# Patient Record
Sex: Female | Born: 1960 | Race: White | Hispanic: No | Smoking: Former smoker
Health system: Southern US, Community
[De-identification: ages and names within clinical notes are randomized; demographics above are authoritative.]

## PROBLEM LIST (undated history)

## (undated) DIAGNOSIS — M8580 Other specified disorders of bone density and structure, unspecified site: Secondary | ICD-10-CM

## (undated) DIAGNOSIS — R5383 Other fatigue: Secondary | ICD-10-CM

## (undated) DIAGNOSIS — I779 Disorder of arteries and arterioles, unspecified: Secondary | ICD-10-CM

## (undated) DIAGNOSIS — K769 Liver disease, unspecified: Secondary | ICD-10-CM

## (undated) DIAGNOSIS — S62609A Fracture of unspecified phalanx of unspecified finger, initial encounter for closed fracture: Secondary | ICD-10-CM

## (undated) DIAGNOSIS — J301 Allergic rhinitis due to pollen: Secondary | ICD-10-CM

## (undated) DIAGNOSIS — G459 Transient cerebral ischemic attack, unspecified: Secondary | ICD-10-CM

## (undated) DIAGNOSIS — K760 Fatty (change of) liver, not elsewhere classified: Secondary | ICD-10-CM

## (undated) DIAGNOSIS — K219 Gastro-esophageal reflux disease without esophagitis: Secondary | ICD-10-CM

## (undated) DIAGNOSIS — E7800 Pure hypercholesterolemia, unspecified: Secondary | ICD-10-CM

## (undated) DIAGNOSIS — G4700 Insomnia, unspecified: Secondary | ICD-10-CM

## (undated) HISTORY — DX: Insomnia, unspecified: G47.00

## (undated) HISTORY — DX: Pure hypercholesterolemia, unspecified: E78.00

## (undated) HISTORY — DX: Gastro-esophageal reflux disease without esophagitis: K21.9

## (undated) HISTORY — DX: Fracture of unspecified phalanx of unspecified finger, initial encounter for closed fracture: S62.609A

## (undated) HISTORY — PX: GANGLION CYST EXCISION: SHX1691

## (undated) HISTORY — DX: Allergic rhinitis due to pollen: J30.1

## (undated) HISTORY — DX: Transient cerebral ischemic attack, unspecified: G45.9

## (undated) HISTORY — DX: Fatty (change of) liver, not elsewhere classified: K76.0

## (undated) HISTORY — DX: Other fatigue: R53.83

## (undated) HISTORY — DX: Other specified disorders of bone density and structure, unspecified site: M85.80

## (undated) HISTORY — DX: Liver disease, unspecified: K76.9

## (undated) HISTORY — DX: Disorder of arteries and arterioles, unspecified: I77.9

---

## 1984-02-17 HISTORY — PX: TMJ ARTHROPLASTY: SHX1066

## 1997-05-29 ENCOUNTER — Other Ambulatory Visit: Admission: RE | Admit: 1997-05-29 | Discharge: 1997-05-29 | Payer: Self-pay | Admitting: Obstetrics and Gynecology

## 1998-02-16 HISTORY — PX: LAPAROSCOPIC APPENDECTOMY: SUR753

## 1998-06-28 ENCOUNTER — Other Ambulatory Visit: Admission: RE | Admit: 1998-06-28 | Discharge: 1998-06-28 | Payer: Self-pay | Admitting: Gynecology

## 1999-02-24 ENCOUNTER — Encounter: Payer: Self-pay | Admitting: Family Medicine

## 1999-02-24 ENCOUNTER — Encounter: Admission: RE | Admit: 1999-02-24 | Discharge: 1999-02-24 | Payer: Self-pay | Admitting: Family Medicine

## 1999-04-11 ENCOUNTER — Inpatient Hospital Stay (HOSPITAL_COMMUNITY): Admission: EM | Admit: 1999-04-11 | Discharge: 1999-04-12 | Payer: Self-pay | Admitting: *Deleted

## 1999-04-11 ENCOUNTER — Encounter (INDEPENDENT_AMBULATORY_CARE_PROVIDER_SITE_OTHER): Payer: Self-pay | Admitting: Specialist

## 1999-04-11 ENCOUNTER — Encounter: Payer: Self-pay | Admitting: *Deleted

## 1999-07-22 ENCOUNTER — Other Ambulatory Visit: Admission: RE | Admit: 1999-07-22 | Discharge: 1999-07-22 | Payer: Self-pay | Admitting: Gynecology

## 2000-08-06 ENCOUNTER — Ambulatory Visit (HOSPITAL_COMMUNITY): Admission: RE | Admit: 2000-08-06 | Discharge: 2000-08-06 | Payer: Self-pay | Admitting: Gastroenterology

## 2000-10-08 ENCOUNTER — Other Ambulatory Visit: Admission: RE | Admit: 2000-10-08 | Discharge: 2000-10-08 | Payer: Self-pay | Admitting: Gynecology

## 2001-10-11 ENCOUNTER — Other Ambulatory Visit: Admission: RE | Admit: 2001-10-11 | Discharge: 2001-10-11 | Payer: Self-pay | Admitting: Gynecology

## 2003-01-18 ENCOUNTER — Other Ambulatory Visit: Admission: RE | Admit: 2003-01-18 | Discharge: 2003-01-18 | Payer: Self-pay | Admitting: Gynecology

## 2003-02-17 DIAGNOSIS — S62609A Fracture of unspecified phalanx of unspecified finger, initial encounter for closed fracture: Secondary | ICD-10-CM

## 2003-02-17 HISTORY — DX: Fracture of unspecified phalanx of unspecified finger, initial encounter for closed fracture: S62.609A

## 2003-05-19 ENCOUNTER — Ambulatory Visit (HOSPITAL_COMMUNITY): Admission: RE | Admit: 2003-05-19 | Discharge: 2003-05-19 | Payer: Self-pay | Admitting: Orthopedic Surgery

## 2003-05-20 ENCOUNTER — Emergency Department (HOSPITAL_COMMUNITY): Admission: EM | Admit: 2003-05-20 | Discharge: 2003-05-20 | Payer: Self-pay | Admitting: Emergency Medicine

## 2004-01-21 ENCOUNTER — Other Ambulatory Visit: Admission: RE | Admit: 2004-01-21 | Discharge: 2004-01-21 | Payer: Self-pay | Admitting: Gynecology

## 2004-02-17 HISTORY — PX: SHOULDER SURGERY: SHX246

## 2005-01-21 ENCOUNTER — Other Ambulatory Visit: Admission: RE | Admit: 2005-01-21 | Discharge: 2005-01-21 | Payer: Self-pay | Admitting: Gynecology

## 2005-07-23 ENCOUNTER — Other Ambulatory Visit: Admission: RE | Admit: 2005-07-23 | Discharge: 2005-07-23 | Payer: Self-pay | Admitting: Gynecology

## 2005-09-30 ENCOUNTER — Encounter (INDEPENDENT_AMBULATORY_CARE_PROVIDER_SITE_OTHER): Payer: Self-pay | Admitting: Specialist

## 2005-09-30 ENCOUNTER — Ambulatory Visit (HOSPITAL_COMMUNITY): Admission: RE | Admit: 2005-09-30 | Discharge: 2005-09-30 | Payer: Self-pay | Admitting: Gastroenterology

## 2006-01-22 ENCOUNTER — Other Ambulatory Visit: Admission: RE | Admit: 2006-01-22 | Discharge: 2006-01-22 | Payer: Self-pay | Admitting: Gynecology

## 2007-01-26 ENCOUNTER — Other Ambulatory Visit: Admission: RE | Admit: 2007-01-26 | Discharge: 2007-01-26 | Payer: Self-pay | Admitting: Gynecology

## 2008-02-02 ENCOUNTER — Encounter: Payer: Self-pay | Admitting: Women's Health

## 2008-02-02 ENCOUNTER — Other Ambulatory Visit: Admission: RE | Admit: 2008-02-02 | Discharge: 2008-02-02 | Payer: Self-pay | Admitting: Gynecology

## 2008-02-02 ENCOUNTER — Ambulatory Visit: Payer: Self-pay | Admitting: Women's Health

## 2009-01-07 ENCOUNTER — Encounter: Admission: RE | Admit: 2009-01-07 | Discharge: 2009-01-07 | Payer: Self-pay | Admitting: Gynecology

## 2009-02-04 ENCOUNTER — Ambulatory Visit: Payer: Self-pay | Admitting: Women's Health

## 2009-02-04 ENCOUNTER — Other Ambulatory Visit: Admission: RE | Admit: 2009-02-04 | Discharge: 2009-02-04 | Payer: Self-pay | Admitting: Gynecology

## 2009-07-25 ENCOUNTER — Encounter: Admission: RE | Admit: 2009-07-25 | Discharge: 2009-07-25 | Payer: Self-pay | Admitting: Gynecology

## 2009-08-26 ENCOUNTER — Encounter: Admission: RE | Admit: 2009-08-26 | Discharge: 2009-08-26 | Payer: Self-pay | Admitting: Family Medicine

## 2010-01-29 ENCOUNTER — Encounter
Admission: RE | Admit: 2010-01-29 | Discharge: 2010-01-29 | Payer: Self-pay | Source: Home / Self Care | Attending: Gynecology | Admitting: Gynecology

## 2010-02-05 ENCOUNTER — Other Ambulatory Visit
Admission: RE | Admit: 2010-02-05 | Discharge: 2010-02-05 | Payer: Self-pay | Source: Home / Self Care | Admitting: Gynecology

## 2010-02-05 ENCOUNTER — Ambulatory Visit: Payer: Self-pay | Admitting: Women's Health

## 2010-07-04 NOTE — Procedures (Signed)
[place]. [facility]  Patient:    Lori Wilcox, Lori Wilcox                         MRN: [id number] Proc. Date: 08/06/00 Adm. Date:  [id number] Attending:  Charna Elizabeth CC:         Teena Irani. Arlyce Dice, M.D., [facility]   Procedure Report  DATE OF BIRTH:  Feb 23, 1960.  PROCEDURE:  Colonoscopy.  ENDOSCOPIST:  Anselmo Rod, M.D.  INSTRUMENT USED:  Olympus video colonoscope.  INDICATION FOR PROCEDURE:  Rectal bleeding in a 50 year old white female. Rule out colonic polyps, masses, hemorrhoids, etc.  PREPROCEDURE PREPARATION:  Informed consent was procured from the patient. The patient was fasted for eight hours prior to the procedure and prepped with a bottle of magnesium citrate and a gallon of NuLytely the night prior to the procedure.  PREPROCEDURE PHYSICAL:  VITAL SIGNS:  The patient had stable vital signs.  NECK:  Supple.  CHEST:  Clear to auscultation.  S1, S2 regular.  ABDOMEN:  Soft with normal bowel sounds.  DESCRIPTION OF PROCEDURE:  The patient was placed in the left lateral decubitus position and sedated with 50 mg of Demerol and 5 mg of Versed intravenously.  Once the patient was adequately sedate and maintained on low-flow oxygen and continuous cardiac monitoring, the Olympus video colonoscope was advanced from the rectum to the cecum without difficultly. Except for small, nonbleeding internal and external hemorrhoid, no other abnormalities were seen.  The entire colonic mucosa appeared healthy.  No masses, polyps, erosions, or diverticula were present.  IMPRESSION:  Healthy-appearing colon except for small, nonbleeding internal and external hemorrhoid.  RECOMMENDATIONS: 1. The patient has been advised to increase the fluid and fiber in her diet. 2. Outpatient follow-up is advised in the next two weeks or earlier if need    be. DD:  08/06/00 TD:  08/07/00 Job: [id number]

## 2010-07-04 NOTE — Op Note (Signed)
NAME:  [name], [name]                            ACCOUNT NO.:  [id number]   MEDICAL RECORD NO.:  [id number]                   PATIENT TYPE:  EMS   LOCATION:  ED                                   FACILITY:  [facility]   PHYSICIAN:  Dionne Ano. Everlene Other, M.D.         DATE OF BIRTH:  08-24-1960   DATE OF PROCEDURE:  05/20/2003  DATE OF DISCHARGE:                                 OPERATIVE REPORT   Lori Wilcox was seen at [facility] emergency department.  She is a  postoperative patient of mine.  She underwent reconstructive surgery  yesterday about her ring finger right hand.  She was having significant pain  uncontrolled with p.o. medicine.  We discussed her situation and she came in  for me to evaluate her to make sure there was no postoperative  complications.  Upon viewing [name] and her hand, she had good refill.  I  removed her splint which did not appear to be tight or compressing her skin.  She had excellent refill, no signs of infection, dystrophy, vascular  compromise or compartment syndrome or acute carpal tunnel syndrome.  This  looked normal.  I then replaced her sterilely with sterile gloves.  A  dressing consisting of Adaptic, gauze, Kling and an ulnar gutter splint was  applied with plaster of Paris.  The patient tolerated the procedure well.  There were no complicating features.  She was given Demerol 75 mg and  Phenergan 25 mg IM.  The patient tolerated the procedure well.  Following  this, she was discharged to home I switched her from Percocet to Dilaudid 2  mg one to two every four hours by mouth.  I will see her back if there are  any complicating features or problems.  I have discussed with her do's and  don't's, etc. and will see her back in the office at a regularly scheduled  visit.                                               Dionne Ano. Everlene Other, M.D.    [name]  D:  05/20/2003  T:  05/21/2003  Job:  [id number]

## 2010-07-04 NOTE — Op Note (Signed)
NAME:  BRIANNIE, GUTIERREZ                  ACCOUNT NO.:  [id number]   MEDICAL RECORD NO.:  [id number]          PATIENT TYPE:  AMB   LOCATION:  ENDO                         FACILITY:  [facility]   PHYSICIAN:  Anselmo Rod, M.D.  DATE OF BIRTH:  [birth year]   DATE OF PROCEDURE:  09/30/2005  DATE OF DISCHARGE:                                 OPERATIVE REPORT   PROCEDURE PERFORMED:  Colonoscopy with snare polypectomy x1.   ENDOSCOPIST:  Anselmo Rod, M.D.   INSTRUMENT USED:  Olympus video colonoscope.   INDICATIONS FOR PROCEDURE:  A 50 year old white female with a family history  of colon cancer in a paternal grandfather undergoing a screening colonoscopy  to rule out colonic polyps, masses, etc.   PREPROCEDURE PREPARATION:  Informed consent was procured from the patient.  The patient fasted for four hours prior to the procedure and prepped with 32  Osmoprep pills the night of and the morning of the procedure.  Risks and  benefits of the procedure including a 10% miss rate of cancer and polyps was  discussed with the patient as well.   PREPROCEDURE PHYSICAL:  VITAL SIGNS:  Stable vital signs.  NECK:  Supple.  CHEST:  Clear to auscultation.  CARDIOVASCULAR:  S1 and S2 regular.  ABDOMEN:  Soft with normal bowel sounds.   DESCRIPTION OF PROCEDURE:  The patient was placed in left lateral decubitus  position, sedated with 75 mcg of Fentanyl and 7.5 mg of Versed in slow  incremental doses.  Once the patient was adequately sedated and maintained  on low flow oxygen and continuous cardiac monitoring, the Olympus video  colonoscope was advanced from the rectum to the cecum.  The appendiceal  orifice and ileocecal valve were clearly visualized and photographed.  A  small sessile polyp was removed by hot snare (hot snare x1) from the  rectosigmoid colon.  Retroflexion in the rectum revealed small internal  hemorrhoids.  A small external hemorrhoid was seen on anal inspection as  well.  No evidence  of diverticulosis.  The patient tolerated the procedure  well without complications.   IMPRESSION:  1. Small external hemorrhoid.  2. Small internal hemorrhoids.  3. Small sessile polyp removed by hot snare from the rectosigmoid colon.  4. Normal-appearing descending colon, transverse colon, right colon and      cecum.   RECOMMENDATIONS:  1. Await pathology results.  2. Repeat colonoscopy depending on pathology results.  3. Avoid all nonsteroidals including Aspirin for the next two weeks.  4. Outpatient follow-up as need arises in the future.      Anselmo Rod, M.D.  Electronically Signed     JNM/MEDQ  D:  09/30/2005  T:  09/30/2005  Job:  [id number]   cc:   Teena Irani. Arlyce Dice, M.D.

## 2010-07-04 NOTE — H&P (Signed)
[place]. [facility]  Patient:    Lori Wilcox, Lori Wilcox                         MRN: [id number] Adm. Date:  [id number] Attending:  Brandy Hale CC:         Teena Irani. Arlyce Dice, M.D., [facility]                         History and Physical  CHIEF COMPLAINT:  Lower abdominal pain and vomiting.  HISTORY OF PRESENT ILLNESS:  This is a 50 year old white female who was well until 10 a.m. yesterday, April 10, 1999.  At that time, she developed bilateral lower abdominal pain, which was gradual in onset, initially steady, but has been somewhat crampy and progressive since that time.  At 10 p.m. last night, she began vomiting and has vomited about six times.  She has not had any real change in her bowel habits suddenly, although she does have a six-month history of alternating diarrhea and constipation which have been under evaluation by Dr. Teena Irani. Arlyce Dice without specific diagnosis.  She has not seen any blood in her stools.  Her last menstrual period was six days ago.  She is on birth control pills and states that her period is actually several days early.  She has not had any vaginal discharge or history of pelvic infection.  She was evaluated by the P.A. in the emergency department who felt that she had  appendicitis and I was called to see the patient.  PAST MEDICAL HISTORY:  She has had TMJ surgery.  She has had a ganglion surgery of her right wrist.  She has not had any medical or surgical problems otherwise. ne year ago, she was admitted to a hospital in [place], [place] overnight for lower abdominal pain and elevated white count and was discharged the following morning when symptoms and the leukocytosis resolved.  She did not have any imaging studies.  CURRENT MEDICATIONS:  Birth control pills and Claritin.  DRUG ALLERGIES:  None known.  SOCIAL HISTORY:  The patient is married and lives in [place] with  her husband. They do not have any children; she has never been pregnant.  She denies the use of alcohol or tobacco.  She works as a IT trainer.  FAMILY HISTORY:  Mother is living; has had a history of breast cancer.  Father  living; no medical problems.  Siblings -- no medical problems.  REVIEW OF SYSTEMS:  All systems reviewed are noncontributory.  PHYSICAL EXAMINATION:  GENERAL:  Healthy, thin young woman in moderate distress from abdominal pain.  VITAL SIGNS:  Temperature 96.9, pulse 86 and regular, respirations 24, blood pressure 129/71.  HEENT:  Pupils are equally round and reactive to light and accommodation. Extraocular movements are intact.  Sclerae clear.  Oropharynx without gross lesions.  NECK:  Supple.  Nontender.  No mass.  No thyromegaly.  No bruit.  No adenopathy.  LUNGS:  Clear to auscultation.  No CVA tenderness.  HEART:  Regular rate and rhythm.  No murmur.  BREASTS:  No dominant mass.  No skin change.  ABDOMEN:  Not distended.  Soft.  Diminished bowel sounds.  Localized tenderness and involuntary guarding and percussion tenderness in the right lower quadrant.  No  mass.  No hernia.  PELVIC:   Exam performed by the P.A. shows no cervical tenderness, no discharge and no vaginal mass.  Somewhat tender in the right adnexa.  RECTAL:  Hemoccult-negative stool.  EXTREMITIES:  No edema.  Good pulses.  NEUROLOGIC:  Grossly within normal limits.  ADMISSION DATA:  White blood cell count 14,100.  Hemoglobin 12.5.  Urinalysis normal.  Urine pregnancy test normal.  Comprehensive metabolic panel normal.  Abdominal x-rays not very remarkable.  IMPRESSION:  Right lower quadrant pain, most likely due to acute appendicitis.  Other etiologies such as diverticulitis, Crohns disease, adnexal disease and so  forth cannot be ruled out, but appendicitis most likely.  PLAN: 1. The patient will be taken to the operating room for diagnostic laparoscopy and a    possible  laparoscopic appendectomy. 2. Operative strategy, risks, complications, disabilities were discussed in detail    with the patient and her husband.  They have had all their questions answered at    this time.  They fully agree with this plan. DD:  04/11/99 TD:  04/11/99 Job: [id number]

## 2010-07-04 NOTE — Op Note (Signed)
NAME:  Lori Wilcox, Lori Wilcox                            ACCOUNT NO.:  [id number]   MEDICAL RECORD NO.:  [id number]                   PATIENT TYPE:  OIB   LOCATION:  2899                                 FACILITY:  [facility]   PHYSICIAN:  Dionne Ano. Everlene Other, M.D.         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   PROCEDURE:  1. Corrective osteotomy malunion, middle phalanx about the distal     interphalangeal joint, right ring finger.  2. Stress radiography.   SURGEON:  Dionne Ano. Amanda Pea, M.D.   ASSISTANT:  Karie Chimera, P.A.-C.   COMPLICATIONS:  None.   ANESTHESIA:  General anesthetic with preoperative axillary block under the  direction of Dr. Jairo Ben.   TOURNIQUET TIME:  Less than an hour.   INDICATIONS FOR PROCEDURE:  This patient is a very pleasant female who  presents with the above-mentioned diagnosis.  She has a malunion of her  finger.  Unfortunately, the articular surface is quite stepped off and is  going to be incongruent and cause degenerative problems in the future.  Thus, I have counseled her in regards to the risks and benefits of surgery  including the risk of infection, bleeding, anesthesia, damage to normal  structures and failure of surgery, decompensation, to undergo __________  flexion.  With this in mind, she desires to proceed. The goal of the  surgery, of course, is to prevent degenerative change in the future.  She  knows she may have some stiffness and loss of motion but in hopes to keep  the joint from having rapid degenerative changes, we are planning ORIF of  the malunion with take  down, corrective osteotomy, etc.  She understands  this, the risks and benefits, etc.   OPERATIVE REPORT/PROCEDURE:  The patient was taken to the operative suite.  Preoperative Ancef was given.  She was placed on the operative table,  underwent a general anesthetic.  The preoperative axillary block was in  fairly good fashion.  Following adequate anesthesia, she was appropriately  padded and then prepped and draped in the usual sterile fashion with  Betadine scrub and paint.  Once a sterile field was secured, the patient had  the finger identified under fluoroscopy.  A longitudinal incision was made,  curving slightly ulnarly.  The skin flap was elevated.  The extensor tendon  was identified and split longitudinally.  I then very carefully elevated the  extensor tendon off the periosteal tissue and identified the malunion site.  At this time, I then went ahead and took down the malunion to my  satisfaction with a combination curet,  freer and dental pick.  The patient  tolerated this very well without complicating features.  I then placed a  pick and combination elevator into the fracture site, gently freeing this  up.  This was a take down of the malunion and essentially a corrective  osteotomy given the fact that this was an old fracture which had healed.  Once I was able to meticulously free up the area, I then placed it into  proper anatomic position, lining the joint line up perfectly as viewed from  the extensor tendon split.  Following this, I placed a Kershner wire for  provisional fixation of the 0.03 variety and following this, I placed a 1.0  screw and a 1.3 screw.  I used the tract of the 0.035 K-wire for the 1.3  screw.  Excellent purchase was obtained with the screw head against the  fracture fragment.  I took care to preserve the blood supply to the  collateral ligament which serves as a source of blood supply to the  fragment, of course.  I then checked this under  fluoroscopy, to perform a  stress radiography and was very pleased with the reduction and fixation.  The patient had the tourniquet then deflated.  Hemostasis was obtained with  bipolar electrocautery as it was during the initial dissection.  The  extensor tendon and DIP joint were copiously irrigated and the extensor  tendon was then repaired about the longitudinal split with 4-0 Mersilene.  I  then closed the skin with 5-0 Prolene to my satisfaction without difficulty.  The patient tolerated this well.  There were no complicating features.  Once  this was done and with excellent refill noted to the finger, we placed a  sterile dressing of Xeroform followed by gauze, a volar finger splint and  then an ulnar gutter splint.  She tolerated this well.  She will be  monitored in the recovery room, given an additional 1 g of Ancef and then  discharged home on Keflex, Percocet and Robaxin as well as Phenergan for any  postoperative nausea.  I have discussed her discharge notes, etc., and all  questions have been encouraged and answered.  It was a pleasure to  participate in her operative care and I look forward to participating in her  postoperative recovery.                                               Dionne Ano. Everlene Other, M.D.    [name]  D:  05/19/2003  T:  05/19/2003  Job:  [id number]

## 2010-07-07 ENCOUNTER — Other Ambulatory Visit: Payer: Self-pay | Admitting: Gynecology

## 2010-07-07 DIAGNOSIS — R920 Mammographic microcalcification found on diagnostic imaging of breast: Secondary | ICD-10-CM

## 2010-07-24 ENCOUNTER — Ambulatory Visit
Admission: RE | Admit: 2010-07-24 | Discharge: 2010-07-24 | Disposition: A | Discharge status: Home / Self Care | Payer: BC Managed Care – PPO | Source: Ambulatory Visit | Attending: Gynecology | Admitting: Gynecology

## 2010-07-24 DIAGNOSIS — R920 Mammographic microcalcification found on diagnostic imaging of breast: Secondary | ICD-10-CM

## 2010-09-17 DIAGNOSIS — R5383 Other fatigue: Secondary | ICD-10-CM

## 2010-09-17 HISTORY — DX: Other fatigue: R53.83

## 2010-12-16 ENCOUNTER — Other Ambulatory Visit: Payer: Self-pay | Admitting: Gynecology

## 2010-12-16 DIAGNOSIS — R921 Mammographic calcification found on diagnostic imaging of breast: Secondary | ICD-10-CM

## 2010-12-16 DIAGNOSIS — R922 Inconclusive mammogram: Secondary | ICD-10-CM

## 2011-01-26 ENCOUNTER — Ambulatory Visit
Admission: RE | Admit: 2011-01-26 | Discharge: 2011-01-26 | Disposition: A | Discharge status: Home / Self Care | Payer: BC Managed Care – PPO | Source: Ambulatory Visit | Attending: Gynecology | Admitting: Gynecology

## 2011-01-26 DIAGNOSIS — R922 Inconclusive mammogram: Secondary | ICD-10-CM

## 2011-01-26 DIAGNOSIS — R921 Mammographic calcification found on diagnostic imaging of breast: Secondary | ICD-10-CM

## 2011-02-12 ENCOUNTER — Other Ambulatory Visit (HOSPITAL_COMMUNITY)
Admission: RE | Admit: 2011-02-12 | Discharge: 2011-02-12 | Disposition: A | Discharge status: Home / Self Care | Payer: BC Managed Care – PPO | Source: Ambulatory Visit | Attending: Obstetrics and Gynecology | Admitting: Obstetrics and Gynecology

## 2011-02-12 ENCOUNTER — Encounter: Payer: Self-pay | Admitting: Women's Health

## 2011-02-12 ENCOUNTER — Ambulatory Visit (INDEPENDENT_AMBULATORY_CARE_PROVIDER_SITE_OTHER): Payer: BC Managed Care – PPO | Admitting: Women's Health

## 2011-02-12 VITALS — BP 118/70 | Ht 64.5 in | Wt 155.0 lb

## 2011-02-12 DIAGNOSIS — R140 Abdominal distension (gaseous): Secondary | ICD-10-CM

## 2011-02-12 DIAGNOSIS — Z309 Encounter for contraceptive management, unspecified: Secondary | ICD-10-CM

## 2011-02-12 DIAGNOSIS — R142 Eructation: Secondary | ICD-10-CM

## 2011-02-12 DIAGNOSIS — Z01419 Encounter for gynecological examination (general) (routine) without abnormal findings: Secondary | ICD-10-CM

## 2011-02-12 DIAGNOSIS — IMO0001 Reserved for inherently not codable concepts without codable children: Secondary | ICD-10-CM

## 2011-02-12 MED ORDER — ETONOGESTREL-ETHINYL ESTRADIOL 0.12-0.015 MG/24HR VA RING: VAGINAL_RING | VAGINAL | Status: DC

## 2011-02-12 NOTE — Progress Notes (Signed)
Lori Wilcox 50/05/1960 [id number]    History:    The patient presents for annual exam.  Contracepting with nuva ring, light cycles every 2-3 months for 2 or 3 days. States feels bloated, has had approximately 10 pound weight gain this past year. Not sexually active. History of normal Paps and mammograms. Had recalls on mammograms with normal followup. Has had numerous labs only abnormality noted was a low vitamin D, currently on 50,000 weekly. Diagnosed with bursitis of her hips, acid reflux, primary care managing.   Past medical history, past surgical history, family history and social history were all reviewed and documented in the EPIC chart. Colonoscopy June of 2012 with one benign polyp. Owns 3 horses, nationally competes in dressage.   ROS:  A  ROS was performed and pertinent positives and negatives are included in the history.  Exam:  Filed Vitals:   02/12/11 1204  BP: 118/70    General appearance:  Normal Head/Neck:  Normal, without cervical or supraclavicular adenopathy. Thyroid:  Symmetrical, normal in size, without palpable masses or nodularity. Respiratory  Effort:  Normal  Auscultation:  Clear without wheezing or rhonchi Cardiovascular  Auscultation:  Regular rate, without rubs, murmurs or gallops  Edema/varicosities:  Not grossly evident Abdominal  Soft,nontender, without masses, guarding or rebound.  Liver/spleen:  No organomegaly noted  Hernia:  None appreciated  Skin  Inspection:  Grossly normal  Palpation:  Grossly normal Neurologic/psychiatric  Orientation:  Normal with appropriate conversation.  Mood/affect:  Normal  Genitourinary    Breasts: Examined lying and sitting.     Right: Without masses, retractions, discharge or axillary adenopathy.     Left: Without masses, retractions, discharge or axillary adenopathy.   Inguinal/mons:  Normal without inguinal adenopathy  External genitalia:  Normal  BUS/Urethra/Skene's glands:  Normal  Bladder:   Normal  Vagina:  Normal  Cervix:  Normal  Uterus:   normal in size, shape and contour.  Midline and mobile  Adnexa/parametria:     Rt: Without masses or tenderness.   Lt: Without masses or tenderness.  Anus and perineum: Normal  Digital rectal exam: Normal sphincter tone without palpated masses or tenderness  Assessment/Plan:  50 y.o. SWF G0  for annual exam.   Perimenopausal symptoms Bloating Vitamin D deficiency/acid reflux/bursitis-primary care labs and meds  Plan: SBEs, annual mammogram, normal this month, exercise, calcium rich diet encouraged. Nuva ring prescription, proper use, given reviewed slight risk for blood clots and strokes. Condoms encouraged if becomes sexually active. Pelvic ultrasound, will schedule due to  bloating. Pap only    Harrington Challenger [facility], 1:03 PM 02/12/2011

## 2011-02-26 ENCOUNTER — Ambulatory Visit (INDEPENDENT_AMBULATORY_CARE_PROVIDER_SITE_OTHER): Payer: BC Managed Care – PPO | Admitting: Women's Health

## 2011-02-26 ENCOUNTER — Ambulatory Visit (INDEPENDENT_AMBULATORY_CARE_PROVIDER_SITE_OTHER): Payer: BC Managed Care – PPO

## 2011-02-26 ENCOUNTER — Encounter: Payer: Self-pay | Admitting: Women's Health

## 2011-02-26 DIAGNOSIS — D259 Leiomyoma of uterus, unspecified: Secondary | ICD-10-CM

## 2011-02-26 DIAGNOSIS — R142 Eructation: Secondary | ICD-10-CM

## 2011-02-26 DIAGNOSIS — D251 Intramural leiomyoma of uterus: Secondary | ICD-10-CM

## 2011-02-26 DIAGNOSIS — R140 Abdominal distension (gaseous): Secondary | ICD-10-CM

## 2011-02-26 DIAGNOSIS — R141 Gas pain: Secondary | ICD-10-CM

## 2011-02-26 DIAGNOSIS — N83339 Acquired atrophy of ovary and fallopian tube, unspecified side: Secondary | ICD-10-CM

## 2011-02-26 NOTE — Progress Notes (Signed)
Patient ID: Lori Wilcox, female   DOB: 11-23-1960, 51 y.o.   MRN: [id number] Presents for an ultrasound. At annual exam had complained of some abdominal pressure/bloating. Nuva ring with cycles every 2-3 months for the past year, not sexually active. Cycles previously were every month. Had a colonoscopy 07/2010 with a benign polyp. She has gained approximately 10 pounds in the last year, mostly in her abdomen.  Ultrasound: anteverted uterus with intramural fibroids 11 x 13 mm, 50 x 14 mm, will 29 x 21 x 25 mm. Right and left ovaries atrophic. No apparent masses seen. Endometrium 3 mm.  Plan: Normality of ultrasound was reviewed. Reviewed importance of increasing exercise, decreasing calories for abdominal weight loss. Did review possible weight gain in abdomen is causing some of the pressure/bloating sensation. Has started a boot camp exercise program, will return to the office if continued problems.

## 2011-03-12 ENCOUNTER — Ambulatory Visit: Payer: BC Managed Care – PPO | Admitting: Women's Health

## 2011-03-12 ENCOUNTER — Other Ambulatory Visit: Payer: BC Managed Care – PPO

## 2011-07-24 ENCOUNTER — Other Ambulatory Visit: Payer: Self-pay | Admitting: *Deleted

## 2011-07-24 DIAGNOSIS — IMO0001 Reserved for inherently not codable concepts without codable children: Secondary | ICD-10-CM

## 2011-07-24 MED ORDER — ETONOGESTREL-ETHINYL ESTRADIOL 0.12-0.015 MG/24HR VA RING: VAGINAL_RING | VAGINAL | Status: DC

## 2011-07-29 ENCOUNTER — Other Ambulatory Visit: Payer: Self-pay | Admitting: *Deleted

## 2011-07-29 DIAGNOSIS — IMO0001 Reserved for inherently not codable concepts without codable children: Secondary | ICD-10-CM

## 2011-07-29 MED ORDER — ETONOGESTREL-ETHINYL ESTRADIOL 0.12-0.015 MG/24HR VA RING: VAGINAL_RING | VAGINAL | Status: DC

## 2011-07-29 NOTE — Progress Notes (Signed)
90 day rx requested

## 2011-12-21 ENCOUNTER — Other Ambulatory Visit: Payer: Self-pay | Admitting: Gynecology

## 2011-12-21 DIAGNOSIS — Z1231 Encounter for screening mammogram for malignant neoplasm of breast: Secondary | ICD-10-CM

## 2012-01-27 ENCOUNTER — Ambulatory Visit
Admission: RE | Admit: 2012-01-27 | Discharge: 2012-01-27 | Disposition: A | Discharge status: Home / Self Care | Payer: BC Managed Care – PPO | Source: Ambulatory Visit | Attending: Gynecology | Admitting: Gynecology

## 2012-01-27 DIAGNOSIS — Z1231 Encounter for screening mammogram for malignant neoplasm of breast: Secondary | ICD-10-CM

## 2012-02-15 ENCOUNTER — Ambulatory Visit (INDEPENDENT_AMBULATORY_CARE_PROVIDER_SITE_OTHER): Payer: BC Managed Care – PPO | Admitting: Women's Health

## 2012-02-15 ENCOUNTER — Encounter: Payer: Self-pay | Admitting: Women's Health

## 2012-02-15 VITALS — BP 112/68 | Ht 65.0 in | Wt 148.0 lb

## 2012-02-15 DIAGNOSIS — N912 Amenorrhea, unspecified: Secondary | ICD-10-CM

## 2012-02-15 DIAGNOSIS — Z01419 Encounter for gynecological examination (general) (routine) without abnormal findings: Secondary | ICD-10-CM

## 2012-02-15 NOTE — Progress Notes (Signed)
Lori Wilcox 1960/11/04 [id number]    History:    The patient presents for annual exam.  Stopped using NuvaRing one month ago amenorrheic. Has not been sexually active for greater than a year, cycles light. History of a benign colon polyp in 2012. Normal mammogram and Pap history. (History of CIN-1 with colposcopy in 1984 with normal Paps after.)  History of small intramural fibroids.   Past medical history, past surgical history, family history and social history were all reviewed and documented in the EPIC chart. Has several horses. Mother died this past year from lung cancer, also history of breast cancer with unknown BRCA status. History of an appendectomy in 2000, TMJ surgery 1986, fractured shoulder 2006, fractured finger 2005. History of fibromyalgia.   ROS:  A  ROS was performed and pertinent positives and negatives are included in the history.  Exam:  Filed Vitals:   02/15/12 1200  BP: 112/68    General appearance:  Normal Head/Neck:  Normal, without cervical or supraclavicular adenopathy. Thyroid:  Symmetrical, normal in size, without palpable masses or nodularity. Respiratory  Effort:  Normal  Auscultation:  Clear without wheezing or rhonchi Cardiovascular  Auscultation:  Regular rate, without rubs, murmurs or gallops  Edema/varicosities:  Not grossly evident Abdominal  Soft,nontender, without masses, guarding or rebound.  Liver/spleen:  No organomegaly noted  Hernia:  None appreciated  Skin  Inspection:  Grossly normal  Palpation:  Grossly normal Neurologic/psychiatric  Orientation:  Normal with appropriate conversation.  Mood/affect:  Normal  Genitourinary    Breasts: Examined lying and sitting.     Right: Without masses, retractions, discharge or axillary adenopathy.     Left: Without masses, retractions, discharge or axillary adenopathy.   Inguinal/mons:  Normal without inguinal adenopathy  External genitalia:  Normal  BUS/Urethra/Skene's glands:   Normal  Bladder:  Normal  Vagina:  Normal  Cervix:  Normal  Uterus:   8 week size.  Midline and mobile  Adnexa/parametria:     Rt: Without masses or tenderness.   Lt: Without masses or tenderness.  Anus and perineum: Normal  Digital rectal exam: Normal sphincter tone without palpated masses or tenderness  Assessment/Plan:  51 y.o. D. WF G0 for annual exam with no complaints.  Perimenopausal exam Normal labs at primary care Benign colon polyp 07/2010  Plan: [facility], triage based on results. Home Hemoccult card given with instructions. Reviewed importance of condoms if become sexually active for infection control. SBE's, annual mammogram, calcium rich diet, vitamin D 2000 daily, continue regular exercise encouraged. Had normal labs at primary care. Pap normal 2012, new screening guidelines reviewed. BRCA testing reviewed, declines at this time.    Harrington Challenger [facility], 12:54 PM 02/15/2012

## 2012-02-15 NOTE — Patient Instructions (Addendum)

## 2012-03-04 ENCOUNTER — Other Ambulatory Visit: Payer: Self-pay | Admitting: Women's Health

## 2012-04-06 ENCOUNTER — Other Ambulatory Visit: Payer: Self-pay | Admitting: Dermatology

## 2012-11-21 ENCOUNTER — Encounter: Payer: Self-pay | Admitting: Women's Health

## 2012-12-20 ENCOUNTER — Other Ambulatory Visit: Payer: Self-pay

## 2012-12-20 DIAGNOSIS — Z1231 Encounter for screening mammogram for malignant neoplasm of breast: Secondary | ICD-10-CM

## 2013-01-30 ENCOUNTER — Ambulatory Visit
Admission: RE | Admit: 2013-01-30 | Discharge: 2013-01-30 | Disposition: A | Discharge status: Home / Self Care | Payer: 59 | Source: Ambulatory Visit

## 2013-01-30 DIAGNOSIS — Z1231 Encounter for screening mammogram for malignant neoplasm of breast: Secondary | ICD-10-CM

## 2013-02-22 ENCOUNTER — Other Ambulatory Visit (HOSPITAL_COMMUNITY)
Admission: RE | Admit: 2013-02-22 | Discharge: 2013-02-22 | Disposition: A | Discharge status: Home / Self Care | Payer: 59 | Source: Ambulatory Visit | Attending: Gynecology | Admitting: Gynecology

## 2013-02-22 ENCOUNTER — Ambulatory Visit (INDEPENDENT_AMBULATORY_CARE_PROVIDER_SITE_OTHER): Payer: 59 | Admitting: Women's Health

## 2013-02-22 ENCOUNTER — Encounter: Payer: Self-pay | Admitting: Women's Health

## 2013-02-22 VITALS — BP 108/70 | Ht 64.25 in | Wt 149.8 lb

## 2013-02-22 DIAGNOSIS — Z01419 Encounter for gynecological examination (general) (routine) without abnormal findings: Secondary | ICD-10-CM

## 2013-02-22 DIAGNOSIS — G4700 Insomnia, unspecified: Secondary | ICD-10-CM

## 2013-02-22 DIAGNOSIS — Z780 Asymptomatic menopausal state: Secondary | ICD-10-CM

## 2013-02-22 DIAGNOSIS — Z860100 Personal history of colon polyps, unspecified: Secondary | ICD-10-CM | POA: Insufficient documentation

## 2013-02-22 MED ORDER — ZOLPIDEM TARTRATE 10 MG PO TABS: 10.0000 mg | ORAL_TABLET | Freq: Every evening | ORAL | Status: DC | PRN

## 2013-02-22 MED ORDER — VITAMIN D (ERGOCALCIFEROL) 1.25 MG (50000 UNIT) PO CAPS: 50000.0000 [IU] | ORAL_CAPSULE | ORAL | Status: DC

## 2013-02-22 NOTE — Addendum Note (Signed)
Addended by: Alen Blew on: 02/22/2013 02:14 PM   Modules accepted: Orders

## 2013-02-22 NOTE — Progress Notes (Signed)
Lori Wilcox 04-03-1960 [id number]    History:    The patient presents for annual exam.  Postmenopausal x1 year/no bleeding/no HRT. Not sexually active in years. 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 Normal mammogram history. Mother breast cancer,  unknown BRCA status, died of lung cancer. Primary care manages labs. Benign colon polyp on colonoscopy 2012.  Past medical history, past surgical history, family history and social history were all reviewed and documented in the EPIC chart. Has horses, doing competitive endurance rides of 25 and 50 miles.  ROS:  A  ROS was performed and pertinent positives and negatives are included in the history.  Exam:  Filed Vitals:   02/22/13 1205  BP: 108/70    General appearance:  Normal Head/Neck:  Normal, without cervical or supraclavicular adenopathy. Thyroid:  Symmetrical, normal in size, without palpable masses or nodularity. Respiratory  Effort:  Normal  Auscultation:  Clear without wheezing or rhonchi Cardiovascular  Auscultation:  Regular rate, without rubs, murmurs or gallops  Edema/varicosities:  Not grossly evident Abdominal  Soft,nontender, without masses, guarding or rebound.  Liver/spleen:  No organomegaly noted  Hernia:  None appreciated  Skin  Inspection:  Grossly normal  Palpation:  Grossly normal Neurologic/psychiatric  Orientation:  Normal with appropriate conversation.  Mood/affect:  Normal  Genitourinary    Breasts: Examined lying and sitting.     Right: Without masses, retractions, discharge or axillary adenopathy.     Left: Without masses, retractions, discharge or axillary adenopathy.   Inguinal/mons:  Normal without inguinal adenopathy  External genitalia:  Normal  BUS/Urethra/Skene's glands:  Normal  Bladder:  Normal  Vagina:  Normal  Cervix:  Normal  Uterus:   normal in size, shape and contour.  Midline and mobile  Adnexa/parametria:     Rt: Without masses or tenderness.   Lt: Without masses or  tenderness.  Anus and perineum: Normal  Digital rectal exam: Normal sphincter tone without palpated masses or tenderness  Assessment/Plan:  53 y.o. SWF G0 for annual exam.     Postmenopausal/no bleeding/no HRT with symptoms Insomnia 1984 CIN-1 normal Paps after  Plan: HRT options reviewed and declined mother history of breast cancer. SBE's, continue annual mammogram, 3-D tomography reviewed and encouraged history of dense breast. Continue regular exercise, healthy lifestyle, aware of safety, avid horse rider. Pap, Pap normal 2012, new screening guidelines reviewed. Labs at primary care. Ambien 10 mg at bedtime when necessary prescription, proper use given and reviewed. Low vitamin D history will continue on 50,000  weekly, prescription, proper use given, has persisted low even after supplementation, will check at primary care.Marland Kitchen DEXA will schedule here.   Huel Cote [facility], 12:44 PM 02/22/2013

## 2013-02-22 NOTE — Patient Instructions (Signed)
Health Recommendations for Postmenopausal Women Respected and ongoing research has looked at the most common causes of death, disability, and poor quality of life in postmenopausal women. The causes include heart disease, diseases of blood vessels, diabetes, depression, cancer, and bone loss (osteoporosis). Many things can be done to help lower the chances of developing these and other common problems: CARDIOVASCULAR DISEASE Heart Disease: A heart attack is a medical emergency. Know the signs and symptoms of a heart attack. Below are things women can do to reduce their risk for heart disease.   Do not smoke. If you smoke, quit.  Aim for a healthy weight. Being overweight causes many preventable deaths. Eat a healthy and balanced diet and drink an adequate amount of liquids.  Get moving. Make a commitment to be more physically active. Aim for 30 minutes of activity on most, if not all days of the week.  Eat for heart health. Choose a diet that is low in saturated fat and cholesterol and eliminate trans fat. Include whole grains, vegetables, and fruits. Read and understand the labels on food containers before buying.  Know your numbers. Ask your caregiver to check your blood pressure, cholesterol (total, HDL, LDL, triglycerides) and blood glucose. Work with your caregiver on improving your entire clinical picture.  High blood pressure. Limit or stop your table salt intake (try salt substitute and food seasonings). Avoid salty foods and drinks. Read labels on food containers before buying. Eating well and exercising can help control high blood pressure. STROKE  Stroke is a medical emergency. Stroke may be the result of a blood clot in a blood vessel in the brain or by a brain hemorrhage (bleeding). Know the signs and symptoms of a stroke. To lower the risk of developing a stroke:  Avoid fatty foods.  Quit smoking.  Control your diabetes, blood pressure, and irregular heart rate. THROMBOPHLEBITIS  (BLOOD CLOT) OF THE LEG  Becoming overweight and leading a stationary lifestyle may also contribute to developing blood clots. Controlling your diet and exercising will help lower the risk of developing blood clots. CANCER SCREENING  Breast Cancer: Take steps to reduce your risk of breast cancer.  You should practice "breast self-awareness." This means understanding the normal appearance and feel of your breasts and should include breast self-examination. Any changes detected, no matter how small, should be reported to your caregiver.  After age 40, you should have a clinical breast exam (CBE) every year.  Starting at age 40, you should consider having a mammogram (breast X-ray) every year.  If you have a family history of breast cancer, talk to your caregiver about genetic screening.  If you are at high risk for breast cancer, talk to your caregiver about having an MRI and a mammogram every year.  Intestinal or Stomach Cancer: Tests to consider are a rectal exam, fecal occult blood, sigmoidoscopy, and colonoscopy. Women who are high risk may need to be screened at an earlier age and more often.  Cervical Cancer:  Beginning at age 30, you should have a Pap test every 3 years as long as the past 3 Pap tests have been normal.  If you have had past treatment for cervical cancer or a condition that could lead to cancer, you need Pap tests and screening for cancer for at least 20 years after your treatment.  If you had a hysterectomy for a problem that was not cancer or a condition that could lead to cancer, then you no longer need Pap tests.    If you are between ages 65 and 70, and you have had normal Pap tests going back 10 years, you no longer need Pap tests.  If Pap tests have been discontinued, risk factors (such as a new sexual partner) need to be reassessed to determine if screening should be resumed.  Some medical problems can increase the chance of getting cervical cancer. In these  cases, your caregiver may recommend more frequent screening and Pap tests.  Uterine Cancer: If you have vaginal bleeding after reaching menopause, you should notify your caregiver.  Ovarian cancer: Other than yearly pelvic exams, there are no reliable tests available to screen for ovarian cancer at this time except for yearly pelvic exams.  Lung Cancer: Yearly chest X-rays can detect lung cancer and should be done on high risk women, such as cigarette smokers and women with chronic lung disease (emphysema).  Skin Cancer: A complete body skin exam should be done at your yearly examination. Avoid overexposure to the sun and ultraviolet light lamps. Use a strong sun block cream when in the sun. All of these things are important in lowering the risk of skin cancer. MENOPAUSE Menopause Symptoms: Hormone therapy products are effective for treating symptoms associated with menopause:  Moderate to severe hot flashes.  Night sweats.  Mood swings.  Headaches.  Tiredness.  Loss of sex drive.  Insomnia.  Other symptoms. Hormone replacement carries certain risks, especially in older women. Women who use or are thinking about using estrogen or estrogen with progestin treatments should discuss that with their caregiver. Your caregiver will help you understand the benefits and risks. The ideal dose of hormone replacement therapy is not known. The Food and Drug Administration (FDA) has concluded that hormone therapy should be used only at the lowest doses and for the shortest amount of time to reach treatment goals.  OSTEOPOROSIS Protecting Against Bone Loss and Preventing Fracture: If you use hormone therapy for prevention of bone loss (osteoporosis), the risks for bone loss must outweigh the risk of the therapy. Ask your caregiver about other medications known to be safe and effective for preventing bone loss and fractures. To guard against bone loss or fractures, the following is recommended:  If  you are less than age 50, take 1000 mg of calcium and at least 600 mg of Vitamin D per day.  If you are greater than age 50 but less than age 70, take 1200 mg of calcium and at least 600 mg of Vitamin D per day.  If you are greater than age 70, take 1200 mg of calcium and at least 800 mg of Vitamin D per day. Smoking and excessive alcohol intake increases the risk of osteoporosis. Eat foods rich in calcium and vitamin D and do weight bearing exercises several times a week as your caregiver suggests. DIABETES Diabetes Melitus: If you have Type I or Type 2 diabetes, you should keep your blood sugar under control with diet, exercise and recommended medication. Avoid too many sweets, starchy and fatty foods. Being overweight can make control more difficult. COGNITION AND MEMORY Cognition and Memory: Menopausal hormone therapy is not recommended for the prevention of cognitive disorders such as Alzheimer's disease or memory loss.  DEPRESSION  Depression may occur at any age, but is common in elderly women. The reasons may be because of physical, medical, social (loneliness), or financial problems and needs. If you are experiencing depression because of medical problems and control of symptoms, talk to your caregiver about this. Physical activity and   exercise may help with mood and sleep. Community and volunteer involvement may help your sense of value and worth. If you have depression and you feel that the problem is getting worse or becoming severe, talk to your caregiver about treatment options that are best for you. ACCIDENTS  Accidents are common and can be serious in the elderly woman. Prepare your house to prevent accidents. Eliminate throw rugs, place hand bars in the bath, shower and toilet areas. Avoid wearing high heeled shoes or walking on wet, snowy, and icy areas. Limit or stop driving if you have vision or hearing problems, or you feel you are unsteady with you movements and  reflexes. HEPATITIS C Hepatitis C is a type of viral infection affecting the liver. It is spread mainly through contact with blood from an infected person. It can be treated, but if left untreated, it can lead to severe liver damage over years. Many people who are infected do not know that the virus is in their blood. If you are a "baby-boomer", it is recommended that you have one screening test for Hepatitis C. IMMUNIZATIONS  Several immunizations are important to consider having during your senior years, including:   Tetanus, diptheria, and pertussis booster shot.  Influenza every year before the flu season begins.  Pneumonia vaccine.  Shingles vaccine.  Others as indicated based on your specific needs. Talk to your caregiver about these. Document Released: 03/27/2005 Document Revised: 01/20/2012 Document Reviewed: 11/21/2007 Exit[employer] Patient Information 2014 [employer].  

## 2013-02-27 ENCOUNTER — Other Ambulatory Visit: Payer: Self-pay | Admitting: Gynecology

## 2013-02-27 DIAGNOSIS — Z780 Asymptomatic menopausal state: Secondary | ICD-10-CM

## 2013-04-16 DIAGNOSIS — M8580 Other specified disorders of bone density and structure, unspecified site: Secondary | ICD-10-CM

## 2013-04-16 HISTORY — DX: Other specified disorders of bone density and structure, unspecified site: M85.80

## 2013-04-20 ENCOUNTER — Other Ambulatory Visit: Payer: Self-pay | Admitting: Gynecology

## 2013-04-20 ENCOUNTER — Ambulatory Visit (INDEPENDENT_AMBULATORY_CARE_PROVIDER_SITE_OTHER): Payer: 59

## 2013-04-20 DIAGNOSIS — M8580 Other specified disorders of bone density and structure, unspecified site: Secondary | ICD-10-CM

## 2013-04-20 DIAGNOSIS — M899 Disorder of bone, unspecified: Secondary | ICD-10-CM

## 2013-04-20 DIAGNOSIS — M949 Disorder of cartilage, unspecified: Secondary | ICD-10-CM

## 2013-04-20 DIAGNOSIS — Z780 Asymptomatic menopausal state: Secondary | ICD-10-CM

## 2013-04-21 ENCOUNTER — Encounter: Payer: Self-pay | Admitting: Gynecology

## 2013-05-10 ENCOUNTER — Telehealth: Payer: Self-pay | Admitting: *Deleted

## 2013-05-10 ENCOUNTER — Other Ambulatory Visit: Payer: Self-pay | Admitting: *Deleted

## 2013-05-10 DIAGNOSIS — M8580 Other specified disorders of bone density and structure, unspecified site: Secondary | ICD-10-CM

## 2013-05-10 NOTE — Telephone Encounter (Signed)
Pt called back regarding BD results. She needs a vit d level. She has been on 50,000 units weekly for 6 months. Order placed. KW CMA

## 2013-05-24 ENCOUNTER — Other Ambulatory Visit: Payer: 59

## 2013-05-24 DIAGNOSIS — M8580 Other specified disorders of bone density and structure, unspecified site: Secondary | ICD-10-CM

## 2013-05-25 LAB — VITAMIN D 25 HYDROXY (VIT D DEFICIENCY, FRACTURES): Vit D, 25-Hydroxy: 46 ng/mL (ref 30–89)

## 2013-05-29 ENCOUNTER — Telehealth: Payer: Self-pay | Admitting: *Deleted

## 2013-05-29 NOTE — Telephone Encounter (Signed)
Please call, best to continue vit D 2000 daily, that will help maintain level.  Also let her know I loved her horse web site. Her TSH was normal at her [facility]

## 2013-05-29 NOTE — Telephone Encounter (Signed)
(  pt aware out of the office) Pt called requesting recent Vit. D results form 05/24/13 which was 46. She asked if continue taking Vit.D.? She mention something about other blood drawn such as TSH level may need to be drawn if continue to be low? Please advise

## 2013-05-30 NOTE — Telephone Encounter (Signed)
Left message for pt to call.

## 2013-05-30 NOTE — Telephone Encounter (Signed)
Pt informed with the below note. 

## 2013-11-28 ENCOUNTER — Other Ambulatory Visit: Payer: Self-pay

## 2013-11-28 DIAGNOSIS — G4700 Insomnia, unspecified: Secondary | ICD-10-CM

## 2013-11-28 MED ORDER — ZOLPIDEM TARTRATE 10 MG PO TABS: 10.0000 mg | ORAL_TABLET | Freq: Every evening | ORAL | Status: DC | PRN

## 2013-11-28 NOTE — Telephone Encounter (Signed)
Called into pharmacy

## 2013-12-22 ENCOUNTER — Other Ambulatory Visit: Payer: Self-pay

## 2013-12-22 DIAGNOSIS — Z1231 Encounter for screening mammogram for malignant neoplasm of breast: Secondary | ICD-10-CM

## 2013-12-26 ENCOUNTER — Other Ambulatory Visit: Payer: Self-pay | Admitting: Dermatology

## 2014-01-31 ENCOUNTER — Ambulatory Visit
Admission: RE | Admit: 2014-01-31 | Discharge: 2014-01-31 | Disposition: A | Discharge status: Home / Self Care | Payer: 59 | Source: Ambulatory Visit

## 2014-01-31 DIAGNOSIS — Z1231 Encounter for screening mammogram for malignant neoplasm of breast: Secondary | ICD-10-CM

## 2014-02-23 ENCOUNTER — Encounter: Payer: Self-pay | Admitting: Women's Health

## 2014-02-23 ENCOUNTER — Ambulatory Visit (INDEPENDENT_AMBULATORY_CARE_PROVIDER_SITE_OTHER): Payer: 59 | Admitting: Women's Health

## 2014-02-23 ENCOUNTER — Other Ambulatory Visit: Payer: Self-pay | Admitting: Women's Health

## 2014-02-23 VITALS — BP 124/80 | Ht 65.0 in | Wt 143.0 lb

## 2014-02-23 DIAGNOSIS — Z01419 Encounter for gynecological examination (general) (routine) without abnormal findings: Secondary | ICD-10-CM

## 2014-02-23 DIAGNOSIS — Z780 Asymptomatic menopausal state: Secondary | ICD-10-CM

## 2014-02-23 DIAGNOSIS — G4700 Insomnia, unspecified: Secondary | ICD-10-CM

## 2014-02-23 DIAGNOSIS — Z13220 Encounter for screening for lipoid disorders: Secondary | ICD-10-CM

## 2014-02-23 LAB — LIPID PANEL
Cholesterol: 247 mg/dL — ABNORMAL HIGH (ref 0–200)
HDL: 93 mg/dL (ref >39)
LDL Cholesterol: 141 mg/dL — ABNORMAL HIGH (ref 0–99)
Total CHOL/HDL Ratio: 2.7 Ratio
Triglycerides: 66 mg/dL (ref <150)
VLDL: 13 mg/dL (ref 0–40)

## 2014-02-23 LAB — COMPREHENSIVE METABOLIC PANEL
ALT: 13 U/L (ref 0–35)
AST: 25 U/L (ref 0–37)
Albumin: 4.8 g/dL (ref 3.5–5.2)
Alkaline Phosphatase: 55 U/L (ref 39–117)
BUN: 12 mg/dL (ref 6–23)
CO2: 28 mEq/L (ref 19–32)
Calcium: 9.9 mg/dL (ref 8.4–10.5)
Chloride: 105 mEq/L (ref 96–112)
Creat: 0.72 mg/dL (ref 0.50–1.10)
GLUCOSE: 88 mg/dL (ref 70–99)
POTASSIUM: 5 meq/L (ref 3.5–5.3)
SODIUM: 141 meq/L (ref 135–145)
Total Bilirubin: 0.5 mg/dL (ref 0.2–1.2)
Total Protein: 7.7 g/dL (ref 6.0–8.3)

## 2014-02-23 LAB — CBC WITH DIFFERENTIAL/PLATELET
BASOS ABS: 0 10*3/uL (ref 0.0–0.1)
BASOS PCT: 0 % (ref 0–1)
EOS ABS: 0 10*3/uL (ref 0.0–0.7)
Eosinophils Relative: 1 % (ref 0–5)
HCT: 37 % (ref 36.0–46.0)
Hemoglobin: 12.2 g/dL (ref 12.0–15.0)
LYMPHS PCT: 36 % (ref 12–46)
Lymphs Abs: 1.7 10*3/uL (ref 0.7–4.0)
MCH: 29.9 pg (ref 26.0–34.0)
MCHC: 33 g/dL (ref 30.0–36.0)
MCV: 90.7 fL (ref 78.0–100.0)
MPV: 9.7 fL (ref 8.6–12.4)
Monocytes Absolute: 0.4 10*3/uL (ref 0.1–1.0)
Monocytes Relative: 8 % (ref 3–12)
NEUTROS ABS: 2.6 10*3/uL (ref 1.7–7.7)
Neutrophils Relative %: 55 % (ref 43–77)
Platelets: 238 10*3/uL (ref 150–400)
RBC: 4.08 MIL/uL (ref 3.87–5.11)
RDW: 13.4 % (ref 11.5–15.5)
WBC: 4.7 10*3/uL (ref 4.0–10.5)

## 2014-02-23 MED ORDER — ZOLPIDEM TARTRATE 10 MG PO TABS: 10.0000 mg | ORAL_TABLET | Freq: Every evening | ORAL | Status: DC | PRN

## 2014-02-23 NOTE — Progress Notes (Signed)
Lori Wilcox June 08, 1960 [id number]    History:    Presents for annual exam.  Postmenopausal/no HRT/no bleeding. 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 Normal mammogram history. Mother breast cancer unknown BRCA status died of lung cancer. Insomnia better, uses rare Ambien. Not sexually active. DEXA 04/2013 T score -1.3 left femoral neck, FRAX 5.1%/0.3%. 2012 benign colon polyp.  Past medical history, past surgical history, family history and social history were all reviewed and documented in the EPIC chart. Endurance horseback rider.   ROS:  A ROS was performed and pertinent positives and negatives are included.  Exam:  Filed Vitals:   02/23/14 1152  BP: 124/80    General appearance:  Normal Thyroid:  Symmetrical, normal in size, without palpable masses or nodularity. Respiratory  Auscultation:  Clear without wheezing or rhonchi Cardiovascular  Auscultation:  Regular rate, without rubs, murmurs or gallops  Edema/varicosities:  Not grossly evident Abdominal  Soft,nontender, without masses, guarding or rebound.  Liver/spleen:  No organomegaly noted  Hernia:  None appreciated  Skin  Inspection:  Grossly normal   Breasts: Examined lying and sitting.     Right: Without masses, retractions, discharge or axillary adenopathy.     Left: Without masses, retractions, discharge or axillary adenopathy. Gentitourinary   Inguinal/mons:  Normal without inguinal adenopathy  External genitalia:  Normal  BUS/Urethra/Skene's glands:  Normal  Vagina:  Normal  Cervix:  Normal  Uterus:   normal in size, shape and contour.  Midline and mobile  Adnexa/parametria:     Rt: Without masses or tenderness.   Lt: Without masses or tenderness.  Anus and perineum: Normal  Digital rectal exam: Normal sphincter tone without palpated masses or tenderness  Assessment/Plan:  54 y.o. S WF G0 for annual exam no complaints.  1984 CIN-1 normal Paps after Postmenopausal/no bleeding/no HRT Insomnia Osteopenia  without elevated FRAX  Plan: SBE's, continue annual 3-D screening mammogram history of dense breasts, calcium rich diet, vitamin D 2000. History of low vitamin D. Continue regular exercise, healthy lifestyle. CBC, CMP, lipid panel, vitamin D, UA, Pap normal 2014, new screening guidelines reviewed. Condoms encouraged if sexually active. Ambien 10 mg at bedtime as needed, prescription, proper use given and reviewed. Home safety, fall prevention and importance of regular exercise reviewed.    Huel Cote WHNP, 1:11 PM 02/23/2014

## 2014-02-23 NOTE — Patient Instructions (Signed)
Health Recommendations for Postmenopausal Women Respected and ongoing research has looked at the most common causes of death, disability, and poor quality of life in postmenopausal women. The causes include heart disease, diseases of blood vessels, diabetes, depression, cancer, and bone loss (osteoporosis). Many things can be done to help lower the chances of developing these and other common problems. CARDIOVASCULAR DISEASE Heart Disease: A heart attack is a medical emergency. Know the signs and symptoms of a heart attack. Below are things women can do to reduce their risk for heart disease.   Do not smoke. If you smoke, quit.  Aim for a healthy weight. Being overweight causes many preventable deaths. Eat a healthy and balanced diet and drink an adequate amount of liquids.  Get moving. Make a commitment to be more physically active. Aim for 30 minutes of activity on most, if not all days of the week.  Eat for heart health. Choose a diet that is low in saturated fat and cholesterol and eliminate trans fat. Include whole grains, vegetables, and fruits. Read and understand the labels on food containers before buying.  Know your numbers. Ask your caregiver to check your blood pressure, cholesterol (total, HDL, LDL, triglycerides) and blood glucose. Work with your caregiver on improving your entire clinical picture.  High blood pressure. Limit or stop your table salt intake (try salt substitute and food seasonings). Avoid salty foods and drinks. Read labels on food containers before buying. Eating well and exercising can help control high blood pressure. STROKE  Stroke is a medical emergency. Stroke may be the result of a blood clot in a blood vessel in the brain or by a brain hemorrhage (bleeding). Know the signs and symptoms of a stroke. To lower the risk of developing a stroke:  Avoid fatty foods.  Quit smoking.  Control your diabetes, blood pressure, and irregular heart rate. THROMBOPHLEBITIS  (BLOOD CLOT) OF THE LEG  Becoming overweight and leading a stationary lifestyle may also contribute to developing blood clots. Controlling your diet and exercising will help lower the risk of developing blood clots. CANCER SCREENING  Breast Cancer: Take steps to reduce your risk of breast cancer.  You should practice "breast self-awareness." This means understanding the normal appearance and feel of your breasts and should include breast self-examination. Any changes detected, no matter how small, should be reported to your caregiver.  After age 40, you should have a clinical breast exam (CBE) every year.  Starting at age 40, you should consider having a mammogram (breast X-ray) every year.  If you have a family history of breast cancer, talk to your caregiver about genetic screening.  If you are at high risk for breast cancer, talk to your caregiver about having an MRI and a mammogram every year.  Intestinal or Stomach Cancer: Tests to consider are a rectal exam, fecal occult blood, sigmoidoscopy, and colonoscopy. Women who are high risk may need to be screened at an earlier age and more often.  Cervical Cancer:  Beginning at age 30, you should have a Pap test every 3 years as long as the past 3 Pap tests have been normal.  If you have had past treatment for cervical cancer or a condition that could lead to cancer, you need Pap tests and screening for cancer for at least 20 years after your treatment.  If you had a hysterectomy for a problem that was not cancer or a condition that could lead to cancer, then you no longer need Pap tests.    If you are between ages 65 and 70, and you have had normal Pap tests going back 10 years, you no longer need Pap tests.  If Pap tests have been discontinued, risk factors (such as a new sexual partner) need to be reassessed to determine if screening should be resumed.  Some medical problems can increase the chance of getting cervical cancer. In these  cases, your caregiver may recommend more frequent screening and Pap tests.  Uterine Cancer: If you have vaginal bleeding after reaching menopause, you should notify your caregiver.  Ovarian Cancer: Other than yearly pelvic exams, there are no reliable tests available to screen for ovarian cancer at this time except for yearly pelvic exams.  Lung Cancer: Yearly chest X-rays can detect lung cancer and should be done on high risk women, such as cigarette smokers and women with chronic lung disease (emphysema).  Skin Cancer: A complete body skin exam should be done at your yearly examination. Avoid overexposure to the sun and ultraviolet light lamps. Use a strong sun block cream when in the sun. All of these things are important for lowering the risk of skin cancer. MENOPAUSE Menopause Symptoms: Hormone therapy products are effective for treating symptoms associated with menopause:  Moderate to severe hot flashes.  Night sweats.  Mood swings.  Headaches.  Tiredness.  Loss of sex drive.  Insomnia.  Other symptoms. Hormone replacement carries certain risks, especially in older women. Women who use or are thinking about using estrogen or estrogen with progestin treatments should discuss that with their caregiver. Your caregiver will help you understand the benefits and risks. The ideal dose of hormone replacement therapy is not known. The Food and Drug Administration (FDA) has concluded that hormone therapy should be used only at the lowest doses and for the shortest amount of time to reach treatment goals.  OSTEOPOROSIS Protecting Against Bone Loss and Preventing Fracture If you use hormone therapy for prevention of bone loss (osteoporosis), the risks for bone loss must outweigh the risk of the therapy. Ask your caregiver about other medications known to be safe and effective for preventing bone loss and fractures. To guard against bone loss or fractures, the following is recommended:  If  you are younger than age 50, take 1000 mg of calcium and at least 600 mg of Vitamin D per day.  If you are older than age 50 but younger than age 70, take 1200 mg of calcium and at least 600 mg of Vitamin D per day.  If you are older than age 70, take 1200 mg of calcium and at least 800 mg of Vitamin D per day. Smoking and excessive alcohol intake increases the risk of osteoporosis. Eat foods rich in calcium and vitamin D and do weight bearing exercises several times a week as your caregiver suggests. DIABETES Diabetes Mellitus: If you have type I or type 2 diabetes, you should keep your blood sugar under control with diet, exercise, and recommended medication. Avoid starchy and fatty foods, and too many sweets. Being overweight can make diabetes control more difficult. COGNITION AND MEMORY Cognition and Memory: Menopausal hormone therapy is not recommended for the prevention of cognitive disorders such as Alzheimer's disease or memory loss.  DEPRESSION  Depression may occur at any age, but it is common in elderly women. This may be because of physical, medical, social (loneliness), or financial problems and needs. If you are experiencing depression because of medical problems and control of symptoms, talk to your caregiver about this. Physical   activity and exercise may help with mood and sleep. Community and volunteer involvement may improve your sense of value and worth. If you have depression and you feel that the problem is getting worse or becoming severe, talk to your caregiver about which treatment options are best for you. ACCIDENTS  Accidents are common and can be serious in elderly woman. Prepare your house to prevent accidents. Eliminate throw rugs, place hand bars in bath, shower, and toilet areas. Avoid wearing high heeled shoes or walking on wet, snowy, and icy areas. Limit or stop driving if you have vision or hearing problems, or if you feel you are unsteady with your movements and  reflexes. HEPATITIS C Hepatitis C is a type of viral infection affecting the liver. It is spread mainly through contact with blood from an infected person. It can be treated, but if left untreated, it can lead to severe liver damage over the years. Many people who are infected do not know that the virus is in their blood. If you are a "baby-boomer", it is recommended that you have one screening test for Hepatitis C. IMMUNIZATIONS  Several immunizations are important to consider having during your senior years, including:   Tetanus, diphtheria, and pertussis booster shot.  Influenza every year before the flu season begins.  Pneumonia vaccine.  Shingles vaccine.  Others, as indicated based on your specific needs. Talk to your caregiver about these. Document Released: 03/27/2005 Document Revised: 06/19/2013 Document Reviewed: 11/21/2007 Exit[employer] Patient Information 2015 [employer]. This information is not intended to replace advice given to you by your health care provider. Make sure you discuss any questions you have with your health care provider.  

## 2014-02-24 LAB — URINALYSIS W MICROSCOPIC + REFLEX CULTURE
Bacteria, UA: NONE SEEN
Bilirubin Urine: NEGATIVE
CRYSTALS: NONE SEEN
Casts: NONE SEEN
GLUCOSE, UA: NEGATIVE mg/dL
Hgb urine dipstick: NEGATIVE
Ketones, ur: NEGATIVE mg/dL
Leukocytes, UA: NEGATIVE
Nitrite: NEGATIVE
PH: 7 (ref 5.0–8.0)
PROTEIN: NEGATIVE mg/dL
SPECIFIC GRAVITY, URINE: 1.006 (ref 1.005–1.030)
SQUAMOUS EPITHELIAL / LPF: NONE SEEN
UROBILINOGEN UA: 0.2 mg/dL (ref 0.0–1.0)

## 2014-02-24 LAB — VITAMIN D 25 HYDROXY (VIT D DEFICIENCY, FRACTURES): VIT D 25 HYDROXY: 25 ng/mL — AB (ref 30–100)

## 2014-03-02 ENCOUNTER — Other Ambulatory Visit: Payer: Self-pay | Admitting: *Deleted

## 2014-03-08 ENCOUNTER — Other Ambulatory Visit: Payer: Self-pay | Admitting: Women's Health

## 2014-03-08 DIAGNOSIS — Z780 Asymptomatic menopausal state: Secondary | ICD-10-CM

## 2014-03-08 MED ORDER — VITAMIN D (ERGOCALCIFEROL) 1.25 MG (50000 UNIT) PO CAPS: 50000.0000 [IU] | ORAL_CAPSULE | ORAL | Status: DC

## 2014-08-28 ENCOUNTER — Other Ambulatory Visit: Payer: Self-pay

## 2014-08-28 DIAGNOSIS — Z780 Asymptomatic menopausal state: Secondary | ICD-10-CM

## 2014-08-28 DIAGNOSIS — G4700 Insomnia, unspecified: Secondary | ICD-10-CM

## 2014-08-29 ENCOUNTER — Other Ambulatory Visit: Payer: Self-pay

## 2014-08-29 DIAGNOSIS — Z780 Asymptomatic menopausal state: Secondary | ICD-10-CM

## 2014-08-30 MED ORDER — ZOLPIDEM TARTRATE 10 MG PO TABS: 10.0000 mg | ORAL_TABLET | Freq: Every evening | ORAL | Status: DC | PRN

## 2014-08-30 NOTE — Telephone Encounter (Signed)
Rx called in 

## 2014-10-24 ENCOUNTER — Other Ambulatory Visit: Payer: Self-pay

## 2014-10-24 DIAGNOSIS — G4700 Insomnia, unspecified: Secondary | ICD-10-CM

## 2014-10-28 MED ORDER — ZOLPIDEM TARTRATE 10 MG PO TABS: 10.0000 mg | ORAL_TABLET | Freq: Every evening | ORAL | Status: DC | PRN

## 2014-10-29 NOTE — Telephone Encounter (Signed)
Called into pharmacy

## 2014-12-26 ENCOUNTER — Other Ambulatory Visit: Payer: Self-pay

## 2014-12-26 DIAGNOSIS — Z1231 Encounter for screening mammogram for malignant neoplasm of breast: Secondary | ICD-10-CM

## 2015-02-04 ENCOUNTER — Ambulatory Visit
Admission: RE | Admit: 2015-02-04 | Discharge: 2015-02-04 | Disposition: A | Discharge status: Home / Self Care | Payer: 59 | Source: Ambulatory Visit

## 2015-02-04 DIAGNOSIS — Z1231 Encounter for screening mammogram for malignant neoplasm of breast: Secondary | ICD-10-CM

## 2015-02-26 ENCOUNTER — Ambulatory Visit (INDEPENDENT_AMBULATORY_CARE_PROVIDER_SITE_OTHER): Payer: 59 | Admitting: Women's Health

## 2015-02-26 ENCOUNTER — Encounter: Payer: Self-pay | Admitting: Women's Health

## 2015-02-26 VITALS — BP 124/80 | Ht 65.0 in | Wt 156.0 lb

## 2015-02-26 DIAGNOSIS — G4700 Insomnia, unspecified: Secondary | ICD-10-CM | POA: Diagnosis not present

## 2015-02-26 DIAGNOSIS — Z86010 Personal history of colonic polyps: Secondary | ICD-10-CM

## 2015-02-26 DIAGNOSIS — Z01419 Encounter for gynecological examination (general) (routine) without abnormal findings: Secondary | ICD-10-CM

## 2015-02-26 MED ORDER — ZOLPIDEM TARTRATE 10 MG PO TABS: 10.0000 mg | ORAL_TABLET | Freq: Every evening | ORAL | Status: DC | PRN

## 2015-02-26 NOTE — Patient Instructions (Signed)

## 2015-02-26 NOTE — Progress Notes (Signed)
Lori Wilcox 07/03/1960 [id number]    History:    Presents for annual exam.  Postmenopausal on no HRT with no bleeding. 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 Normal mammogram history. Labs at primary care. 04/2013 T score -1.3 left femoral neck FRAX 5.1%/0.3%. 2012 benign colon polyp. Mother breast cancer survivor died of lung cancer. Not sexually active in greater than 2 years.  Past medical history, past surgical history, family history and social history were all reviewed and documented in the EPIC chart. 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  ROS:  A ROS was performed and pertinent positives and negatives are included.  Exam:  Filed Vitals:   02/26/15 1203  BP: 124/80    General appearance:  Normal Thyroid:  Symmetrical, normal in size, without palpable masses or nodularity. Respiratory  Auscultation:  Clear without wheezing or rhonchi Cardiovascular  Auscultation:  Regular rate, without rubs, murmurs or gallops  Edema/varicosities:  Not grossly evident Abdominal  Soft,nontender, without masses, guarding or rebound.  Liver/spleen:  No organomegaly noted  Hernia:  None appreciated  Skin  Inspection:  Grossly normal   Breasts: Examined lying and sitting.     Right: Without masses, retractions, discharge or axillary adenopathy.     Left: Without masses, retractions, discharge or axillary adenopathy. Gentitourinary   Inguinal/mons:  Normal without inguinal adenopathy  External genitalia:  Normal  BUS/Urethra/Skene's glands:  Normal  Vagina:  Normal  Cervix:  Normal  Uterus:   normal in size, shape and contour.  Midline and mobile  Adnexa/parametria:     Rt: Without masses or tenderness.   Lt: Without masses or tenderness.  Anus and perineum: Normal  Digital rectal exam: Normal sphincter tone without palpated masses or tenderness  Assessment/Plan:  55 y.o. S WF G0 for annual exam with no complaints.  Postmenopausal/no HRT/no bleeding 1984 CIN-1 normal  Paps after 2012 benign colon polyp Osteopenia without elevated FRAX Primary care-labs Insomnia occasional Ambien  Plan: Ambien 10 mg by mouth at bedtime when necessary, sleep hygiene reviewed, reviewed importance of not taking daily. Instructed to call Dr. Collene Mares for questionable 5-year- or 10 year follow-up colonoscopy. SBE's, continue annual screening 3-D mammogram history of dense breasts. Continue active lifestyle, healthy diet and regular exercise. Home safety, fall prevention reviewed, repeat DEXA next year. UA, Pap normal 2015. Huel Cote WHNP, 1:02 PM 02/26/2015

## 2015-05-20 ENCOUNTER — Telehealth: Payer: Self-pay | Admitting: Genetic Counselor

## 2015-05-20 ENCOUNTER — Encounter: Payer: Self-pay | Admitting: Genetic Counselor

## 2015-05-20 NOTE — Telephone Encounter (Signed)
Verified address and insurance, called and faxed referring provider, mailed new patient packet, scheduled intake.

## 2015-06-26 ENCOUNTER — Encounter: Payer: 59 | Admitting: Genetic Counselor

## 2015-06-26 ENCOUNTER — Other Ambulatory Visit: Payer: 59

## 2016-01-01 ENCOUNTER — Other Ambulatory Visit: Payer: Self-pay | Admitting: Gynecology

## 2016-01-01 DIAGNOSIS — Z1231 Encounter for screening mammogram for malignant neoplasm of breast: Secondary | ICD-10-CM

## 2016-02-05 ENCOUNTER — Ambulatory Visit
Admission: RE | Admit: 2016-02-05 | Discharge: 2016-02-05 | Disposition: A | Discharge status: Home / Self Care | Payer: 59 | Source: Ambulatory Visit | Attending: Gynecology | Admitting: Gynecology

## 2016-02-05 DIAGNOSIS — Z1231 Encounter for screening mammogram for malignant neoplasm of breast: Secondary | ICD-10-CM

## 2016-02-27 ENCOUNTER — Ambulatory Visit (INDEPENDENT_AMBULATORY_CARE_PROVIDER_SITE_OTHER): Payer: 59 | Admitting: Women's Health

## 2016-02-27 ENCOUNTER — Encounter: Payer: Self-pay | Admitting: Women's Health

## 2016-02-27 VITALS — BP 118/76 | Ht 64.5 in | Wt 144.0 lb

## 2016-02-27 DIAGNOSIS — Z01419 Encounter for gynecological examination (general) (routine) without abnormal findings: Secondary | ICD-10-CM | POA: Diagnosis not present

## 2016-02-27 DIAGNOSIS — Z13220 Encounter for screening for lipoid disorders: Secondary | ICD-10-CM | POA: Diagnosis not present

## 2016-02-27 DIAGNOSIS — F5101 Primary insomnia: Secondary | ICD-10-CM | POA: Diagnosis not present

## 2016-02-27 DIAGNOSIS — Z13820 Encounter for screening for osteoporosis: Secondary | ICD-10-CM | POA: Diagnosis not present

## 2016-02-27 DIAGNOSIS — Z1151 Encounter for screening for human papillomavirus (HPV): Secondary | ICD-10-CM

## 2016-02-27 LAB — COMPREHENSIVE METABOLIC PANEL
ALBUMIN: 4.5 g/dL (ref 3.6–5.1)
ALK PHOS: 50 U/L (ref 33–130)
ALT: 10 U/L (ref 6–29)
AST: 20 U/L (ref 10–35)
BILIRUBIN TOTAL: 0.4 mg/dL (ref 0.2–1.2)
BUN: 20 mg/dL (ref 7–25)
CO2: 27 mmol/L (ref 20–31)
CREATININE: 0.78 mg/dL (ref 0.50–1.05)
Calcium: 9.6 mg/dL (ref 8.6–10.4)
Chloride: 103 mmol/L (ref 98–110)
Glucose, Bld: 80 mg/dL (ref 65–99)
Potassium: 4.6 mmol/L (ref 3.5–5.3)
SODIUM: 137 mmol/L (ref 135–146)
TOTAL PROTEIN: 7.4 g/dL (ref 6.1–8.1)

## 2016-02-27 LAB — URINALYSIS W MICROSCOPIC + REFLEX CULTURE
BACTERIA UA: NONE SEEN [HPF]
BILIRUBIN URINE: NEGATIVE
Casts: NONE SEEN [LPF]
Crystals: NONE SEEN [HPF]
GLUCOSE, UA: NEGATIVE
HGB URINE DIPSTICK: NEGATIVE
Ketones, ur: NEGATIVE
LEUKOCYTES UA: NEGATIVE
Nitrite: NEGATIVE
PH: 6 (ref 5.0–8.0)
PROTEIN: NEGATIVE
RBC / HPF: NONE SEEN RBC/HPF (ref <=2)
SQUAMOUS EPITHELIAL / LPF: NONE SEEN [HPF] (ref <=5)
Specific Gravity, Urine: 1.025 (ref 1.001–1.035)
WBC UA: NONE SEEN WBC/HPF (ref <=5)
Yeast: NONE SEEN [HPF]

## 2016-02-27 LAB — LIPID PANEL
CHOLESTEROL: 221 mg/dL — AB (ref <200)
HDL: 72 mg/dL (ref >50)
LDL Cholesterol: 132 mg/dL — ABNORMAL HIGH (ref <100)
TRIGLYCERIDES: 86 mg/dL (ref <150)
Total CHOL/HDL Ratio: 3.1 Ratio (ref <5.0)
VLDL: 17 mg/dL (ref <30)

## 2016-02-27 LAB — CBC WITH DIFFERENTIAL/PLATELET
BASOS ABS: 0 {cells}/uL (ref 0–200)
Basophils Relative: 0 %
EOS PCT: 2 %
Eosinophils Absolute: 102 cells/uL (ref 15–500)
HCT: 37.9 % (ref 35.0–45.0)
HEMOGLOBIN: 12.5 g/dL (ref 11.7–15.5)
LYMPHS ABS: 1938 {cells}/uL (ref 850–3900)
Lymphocytes Relative: 38 %
MCH: 30.4 pg (ref 27.0–33.0)
MCHC: 33 g/dL (ref 32.0–36.0)
MCV: 92.2 fL (ref 80.0–100.0)
MPV: 9.9 fL (ref 7.5–12.5)
Monocytes Absolute: 408 cells/uL (ref 200–950)
Monocytes Relative: 8 %
NEUTROS ABS: 2652 {cells}/uL (ref 1500–7800)
Neutrophils Relative %: 52 %
Platelets: 213 10*3/uL (ref 140–400)
RBC: 4.11 MIL/uL (ref 3.80–5.10)
RDW: 13.2 % (ref 11.0–15.0)
WBC: 5.1 10*3/uL (ref 3.8–10.8)

## 2016-02-27 MED ORDER — ZOLPIDEM TARTRATE 10 MG PO TABS: 10.0000 mg | ORAL_TABLET | Freq: Every evening | ORAL | 3 refills | Status: DC | PRN

## 2016-02-27 NOTE — Patient Instructions (Signed)

## 2016-02-27 NOTE — Progress Notes (Signed)
Lori Wilcox 09-01-1960 [id number]    History:    Presents for annual exam.Post menopausal/no HRT/no bleeding. Not sexual activity. Mild vaginal dryness and hot flashes (triggers wine and stress). Normal Pap and mammogram history. Mother had breast cancer, died of lung cancer. Colonoscopy 1 polyp present, return in five years. Regular skin checks.  Some trouble sleeping during busy season at work. Participating in the 1 year long [employer] program .    Past medical history, past surgical history, family history and social history were all reviewed and documented in the EPIC chart. PMH: Leiomyoma, Skin cancer appendectomy FHx: Mother: Lung and Breast Cancer (Deceased), Father: Healthy, Skin cancer Social Hx: CFO of company, Endurance horseback rider, extended social network and community involvement Sexual Hx: No current pattern; interested in dating  ROS:  A ROS was performed and pertinent positives and negatives are included. Thyroid: No dysphagia, swelling, tenderness Cardiac: No chest pain, palpitations, syncope Respiratory: No dyspnea or SOB Abdomen: No pain, bloating, cramping Breast: No discharge, itching, pain, erythema, stippling Genitourinary: No itching or burning with urination, dyspareunia, back pain, discharge, or odor   Exam:  Vitals:   02/27/16 1209  BP: 118/76  Weight: 144 lb (65.3 kg)  Height: 5' 4.5" (1.638 m)   Body mass index is 24.34 kg/m.   General appearance:  Appeared well Thyroid:  Symmetrical, normal in size, without palpable masses or nodularity. Respiratory  Auscultation:  Clear without wheezing or rhonchi Cardiovascular  Auscultation:  Regular rate, without rubs, murmurs or gallops  Edema/varicosities:  Not grossly evident Abdominal  Soft,nontender, without masses, guarding or rebound.  Liver/spleen:  No organomegaly noted  Hernia:  None appreciated  Skin  Inspection:  Grossly normal; mild creeping   Breasts: Examined lying and  sitting.     Right: Without masses, retractions, discharge or axillary adenopathy.     Left: Without masses, retractions, discharge or axillary adenopathy. Gentitourinary   Inguinal/mons:  Normal without inguinal adenopathy  External genitalia:  Normal, Mildly atrophic  BUS/Urethra/Skene's glands:  Normal  Vagina:  Normal, atrophic  Cervix:  Normal  Uterus:  anteverted, normal in size, shape and contour.  Midline and mobile  Adnexa/parametria:     Rt: Without masses or tenderness.   Lt: Without masses or tenderness.  Anus and perineum: Normal  Digital rectal exam: Normal sphincter tone without palpated masses or tenderness  Assessment/Plan:  56 y.o. D WF G0 for annual exam .    Post- menopausal/no HRT/no bleeding Mild sleep distrubance Vaginal atrophy  Plan:   Mild post- menopausal symptoms. Will continue preventive measures to control symptomology.  Refilled Ambien 10 mg prescription to address sleep disturbance, counseled on addictive effects. Use limited to three times a month. Commended patient on going [employer] program and losing the weight gained during the year. Discussed diet, exercise and calcium rich diet.  Reviewed guidelines for influenza vaccine, patient declines at this time. Advised condom and lubricant use if engage in sexual activity.  Encouraged use of refresh for vaginal atrophy. UA , Vit D, Lipid panel, CMP and CBC . Pap with HR HPV typing, new screening guidelines reviewed advised will contact through my chart with results. Patient will schedule dexa scan following busy season at work.   Huel Cote [facility], 12:50 PM 02/27/2016

## 2016-02-27 NOTE — Addendum Note (Signed)
Addended by: Thurnell Garbe A on: 02/27/2016 02:13 PM   Modules accepted: Orders

## 2016-02-28 LAB — PAP, TP IMAGING W/ HPV RNA, RFLX HPV TYPE 16,18/45: HPV MRNA, HIGH RISK: NOT DETECTED

## 2016-02-28 LAB — VITAMIN D 25 HYDROXY (VIT D DEFICIENCY, FRACTURES): Vit D, 25-Hydroxy: 26 ng/mL — ABNORMAL LOW (ref 30–100)

## 2016-03-02 ENCOUNTER — Other Ambulatory Visit: Payer: Self-pay | Admitting: Gynecology

## 2016-03-02 DIAGNOSIS — E559 Vitamin D deficiency, unspecified: Secondary | ICD-10-CM

## 2016-03-02 DIAGNOSIS — E7800 Pure hypercholesterolemia, unspecified: Secondary | ICD-10-CM

## 2016-04-15 ENCOUNTER — Other Ambulatory Visit: Payer: Self-pay | Admitting: Gynecology

## 2016-04-15 DIAGNOSIS — Z13820 Encounter for screening for osteoporosis: Secondary | ICD-10-CM

## 2016-04-22 ENCOUNTER — Other Ambulatory Visit: Payer: Self-pay | Admitting: Internal Medicine

## 2016-04-22 ENCOUNTER — Ambulatory Visit
Admission: RE | Admit: 2016-04-22 | Discharge: 2016-04-22 | Disposition: A | Discharge status: Home / Self Care | Payer: 59 | Source: Ambulatory Visit | Attending: Internal Medicine | Admitting: Internal Medicine

## 2016-04-22 DIAGNOSIS — R0609 Other forms of dyspnea: Principal | ICD-10-CM

## 2016-04-28 ENCOUNTER — Other Ambulatory Visit: Payer: 59

## 2016-04-28 ENCOUNTER — Other Ambulatory Visit: Payer: Self-pay | Admitting: Gynecology

## 2016-04-28 ENCOUNTER — Ambulatory Visit (INDEPENDENT_AMBULATORY_CARE_PROVIDER_SITE_OTHER): Payer: 59

## 2016-04-28 DIAGNOSIS — M8589 Other specified disorders of bone density and structure, multiple sites: Secondary | ICD-10-CM | POA: Diagnosis not present

## 2016-04-28 DIAGNOSIS — E7800 Pure hypercholesterolemia, unspecified: Secondary | ICD-10-CM

## 2016-04-28 DIAGNOSIS — Z13820 Encounter for screening for osteoporosis: Secondary | ICD-10-CM | POA: Diagnosis not present

## 2016-04-28 DIAGNOSIS — E559 Vitamin D deficiency, unspecified: Secondary | ICD-10-CM

## 2016-04-28 LAB — LIPID PANEL
Cholesterol: 232 mg/dL — ABNORMAL HIGH (ref <200)
HDL: 77 mg/dL (ref >50)
LDL Cholesterol: 143 mg/dL — ABNORMAL HIGH (ref <100)
TRIGLYCERIDES: 60 mg/dL (ref <150)
Total CHOL/HDL Ratio: 3 Ratio (ref <5.0)
VLDL: 12 mg/dL (ref <30)

## 2016-04-29 ENCOUNTER — Encounter: Payer: Self-pay | Admitting: Gynecology

## 2016-04-29 LAB — VITAMIN D 25 HYDROXY (VIT D DEFICIENCY, FRACTURES): VIT D 25 HYDROXY: 32 ng/mL (ref 30–100)

## 2016-04-30 ENCOUNTER — Other Ambulatory Visit: Payer: Self-pay | Admitting: Gynecology

## 2016-04-30 ENCOUNTER — Telehealth: Payer: Self-pay

## 2016-04-30 ENCOUNTER — Encounter: Payer: Self-pay | Admitting: Women's Health

## 2016-04-30 DIAGNOSIS — E559 Vitamin D deficiency, unspecified: Secondary | ICD-10-CM

## 2016-04-30 NOTE — Telephone Encounter (Signed)
Patient advised. Lab appt made for 3 mos and order placed.

## 2016-04-30 NOTE — Telephone Encounter (Signed)
Patient called in voice mail. I called her back and left message to call me.

## 2016-04-30 NOTE — Telephone Encounter (Signed)
I told patient that VIT D 32 and you recommended she take 2000 units VIT D3 daily.  She said she already is taking 4000 units every day. She takes 2000 in the morning and 2000 in the pm.   Please advise how much to recommend she should take.

## 2016-04-30 NOTE — Telephone Encounter (Signed)
-----   Message from Anastasio Auerbach, MD sent at 04/29/2016  9:09 AM EDT ----- Tell patient: #1 vitamin D level at lower end of normal range. I would make sure that she consistently takes the over-the-counter vitamin D 2000 units daily and will recheck it when she comes in for her next annual exam. #2 cholesterol and LDL remain elevated. Her HDL was excellent which is good. I would recommend obtaining a copy of these labs from the [facility] and showing them to her primary physician when she sees him to make sure that he does not want to consider medication.

## 2016-04-30 NOTE — Telephone Encounter (Signed)
I would switch to vitamin D 5000 units tablets and take 1 daily. Recheck vitamin D in 3 months. She can take 3 of her 2000 unit tabs for now until she runs out and then start the 5000 unit daily tablets that she can buy over-the-counter

## 2016-04-30 NOTE — Telephone Encounter (Signed)
Left message for her to call me

## 2016-04-30 NOTE — Telephone Encounter (Signed)
Left message to call.

## 2016-06-12 ENCOUNTER — Other Ambulatory Visit: Payer: Self-pay | Admitting: Gynecology

## 2016-06-12 DIAGNOSIS — E559 Vitamin D deficiency, unspecified: Secondary | ICD-10-CM

## 2016-07-01 ENCOUNTER — Encounter: Payer: Self-pay | Admitting: Gynecology

## 2016-07-30 ENCOUNTER — Other Ambulatory Visit: Payer: 59

## 2016-07-30 DIAGNOSIS — E559 Vitamin D deficiency, unspecified: Secondary | ICD-10-CM

## 2016-07-31 LAB — VITAMIN D 25 HYDROXY (VIT D DEFICIENCY, FRACTURES): VIT D 25 HYDROXY: 39 ng/mL (ref 30–100)

## 2017-03-02 ENCOUNTER — Encounter: Payer: Self-pay | Admitting: Women's Health

## 2017-03-02 ENCOUNTER — Ambulatory Visit (INDEPENDENT_AMBULATORY_CARE_PROVIDER_SITE_OTHER): Payer: 59 | Admitting: Women's Health

## 2017-03-02 VITALS — BP 110/82 | Wt 135.0 lb

## 2017-03-02 DIAGNOSIS — F5101 Primary insomnia: Secondary | ICD-10-CM

## 2017-03-02 DIAGNOSIS — Z13220 Encounter for screening for lipoid disorders: Secondary | ICD-10-CM | POA: Diagnosis not present

## 2017-03-02 DIAGNOSIS — Z01419 Encounter for gynecological examination (general) (routine) without abnormal findings: Secondary | ICD-10-CM

## 2017-03-02 LAB — COMPREHENSIVE METABOLIC PANEL
AG RATIO: 2 (calc) (ref 1.0–2.5)
ALBUMIN MSPROF: 4.9 g/dL (ref 3.6–5.1)
ALKALINE PHOSPHATASE (APISO): 47 U/L (ref 33–130)
ALT: 9 U/L (ref 6–29)
AST: 20 U/L (ref 10–35)
BUN: 15 mg/dL (ref 7–25)
CALCIUM: 9.6 mg/dL (ref 8.6–10.4)
CHLORIDE: 103 mmol/L (ref 98–110)
CO2: 27 mmol/L (ref 20–32)
Creat: 0.68 mg/dL (ref 0.50–1.05)
GLOBULIN: 2.5 g/dL (ref 1.9–3.7)
Glucose, Bld: 79 mg/dL (ref 65–99)
POTASSIUM: 4.5 mmol/L (ref 3.5–5.3)
SODIUM: 139 mmol/L (ref 135–146)
Total Bilirubin: 0.4 mg/dL (ref 0.2–1.2)
Total Protein: 7.4 g/dL (ref 6.1–8.1)

## 2017-03-02 LAB — CBC WITH DIFFERENTIAL/PLATELET
Basophils Absolute: 29 cells/uL (ref 0–200)
Basophils Relative: 0.6 %
Eosinophils Absolute: 59 cells/uL (ref 15–500)
Eosinophils Relative: 1.2 %
HEMATOCRIT: 35.8 % (ref 35.0–45.0)
HEMOGLOBIN: 12.2 g/dL (ref 11.7–15.5)
LYMPHS ABS: 1558 {cells}/uL (ref 850–3900)
MCH: 30.1 pg (ref 27.0–33.0)
MCHC: 34.1 g/dL (ref 32.0–36.0)
MCV: 88.4 fL (ref 80.0–100.0)
MPV: 10.4 fL (ref 7.5–12.5)
Monocytes Relative: 7.4 %
NEUTROS ABS: 2891 {cells}/uL (ref 1500–7800)
NEUTROS PCT: 59 %
Platelets: 208 10*3/uL (ref 140–400)
RBC: 4.05 10*6/uL (ref 3.80–5.10)
RDW: 12.1 % (ref 11.0–15.0)
Total Lymphocyte: 31.8 %
WBC: 4.9 10*3/uL (ref 3.8–10.8)
WBCMIX: 363 {cells}/uL (ref 200–950)

## 2017-03-02 LAB — LIPID PANEL
CHOLESTEROL: 219 mg/dL — AB (ref <200)
HDL: 88 mg/dL (ref >50)
LDL Cholesterol (Calc): 117 mg/dL (calc) — ABNORMAL HIGH
Non-HDL Cholesterol (Calc): 131 mg/dL (calc) — ABNORMAL HIGH (ref <130)
Total CHOL/HDL Ratio: 2.5 (calc) (ref <5.0)
Triglycerides: 57 mg/dL (ref <150)

## 2017-03-02 MED ORDER — ZOLPIDEM TARTRATE 10 MG PO TABS: 10.0000 mg | ORAL_TABLET | Freq: Every evening | ORAL | 3 refills | Status: DC | PRN

## 2017-03-02 NOTE — Progress Notes (Signed)
Lori Wilcox 1961/01/07 [id number]    History:    Presents for annual exam.  Postmenopausal on no HRT with no bleeding. Not sexually active. 2012 benign colon polyps, patient reports 2017 no polyps on colonoscopy/ Dr. Collene Mares. Normal Pap and mammogram history. 2018 T score +1.2 at spine, hip -1.6 at femoral neck FRAX 5.8%/0.4%. Has lost 10 pounds since last annual.  Past medical history, past surgical history, family history and social history were all reviewed and documented in the EPIC chart. 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 Has 3 other horses and a miniature horse. Mother breast cancer and deceased from lung cancer. Father 56 and well.  ROS:  A ROS was performed and pertinent positives and negatives are included.  Exam:  Vitals:   03/02/17 1148  BP: 110/82  Weight: 135 lb (61.2 kg)   Body mass index is 22.81 kg/m.   General appearance:  Normal Thyroid:  Symmetrical, normal in size, without palpable masses or nodularity. Respiratory  Auscultation:  Clear without wheezing or rhonchi Cardiovascular  Auscultation:  Regular rate, without rubs, murmurs or gallops  Edema/varicosities:  Not grossly evident Abdominal  Soft,nontender, without masses, guarding or rebound.  Liver/spleen:  No organomegaly noted  Hernia:  None appreciated  Skin  Inspection:  Grossly normal   Breasts: Examined lying and sitting.     Right: Without masses, retractions, discharge or axillary adenopathy.     Left: Without masses, retractions, discharge or axillary adenopathy. Gentitourinary   Inguinal/mons:  Normal without inguinal adenopathy  External genitalia:  Normal  BUS/Urethra/Skene's glands:  Normal  Vagina:  Normal  Cervix:  Normal  Uterus: normal in size, shape and contour.  Midline and mobile  Adnexa/parametria:     Rt: Without masses or tenderness.   Lt: Without masses or tenderness.  Anus and perineum: Normal  Digital rectal exam: Normal  sphincter tone without palpated masses or tenderness  Assessment/Plan:  57 y.o. S WF G0  for annual exam with no complaints.  Postmenopausal/no HRT/no bleeding Osteopenia without elevated FRAX  Plan: Encouraged to continue healthy active lifestyle of regular exercise, healthy diet. SBE's, continue annual screening mammogram, calcium rich diet, vitamin D 2000 daily encouraged. Home safety, fall prevention and importance of weightbearing exercise reviewed. CBC, CMP, lipid panel, Pap normal 2018, new screening guidelines reviewed. Prescription for Ambien 10 mg takes half tablet especially when travels, aware of addictive properties into use sparingly.    Huel Cote [facility], 12:51 PM 03/02/2017

## 2017-03-02 NOTE — Patient Instructions (Signed)
Health Maintenance for Postmenopausal Women Menopause is a normal process in which your reproductive ability comes to an end. This process happens gradually over a span of months to years, usually between the ages of 22 and 9. Menopause is complete when you have missed 12 consecutive menstrual periods. It is important to talk with your health care provider about some of the most common conditions that affect postmenopausal women, such as heart disease, cancer, and bone loss (osteoporosis). Adopting a healthy lifestyle and getting preventive care can help to promote your health and wellness. Those actions can also lower your chances of developing some of these common conditions. What should I know about menopause? During menopause, you may experience a number of symptoms, such as:  Moderate-to-severe hot flashes.  Night sweats.  Decrease in sex drive.  Mood swings.  Headaches.  Tiredness.  Irritability.  Memory problems.  Insomnia.  Choosing to treat or not to treat menopausal changes is an individual decision that you make with your health care provider. What should I know about hormone replacement therapy and supplements? Hormone therapy products are effective for treating symptoms that are associated with menopause, such as hot flashes and night sweats. Hormone replacement carries certain risks, especially as you become older. If you are thinking about using estrogen or estrogen with progestin treatments, discuss the benefits and risks with your health care provider. What should I know about heart disease and stroke? Heart disease, heart attack, and stroke become more likely as you age. This may be due, in part, to the hormonal changes that your body experiences during menopause. These can affect how your body processes dietary fats, triglycerides, and cholesterol. Heart attack and stroke are both medical emergencies. There are many things that you can do to help prevent heart disease  and stroke:  Have your blood pressure checked at least every 1-2 years. High blood pressure causes heart disease and increases the risk of stroke.  If you are 53-22 years old, ask your health care provider if you should take aspirin to prevent a heart attack or a stroke.  Do not use any tobacco products, including cigarettes, chewing tobacco, or electronic cigarettes. If you need help quitting, ask your health care provider.  It is important to eat a healthy diet and maintain a healthy weight. ? Be sure to include plenty of vegetables, fruits, low-fat dairy products, and lean protein. ? Avoid eating foods that are high in solid fats, added sugars, or salt (sodium).  Get regular exercise. This is one of the most important things that you can do for your health. ? Try to exercise for at least 150 minutes each week. The type of exercise that you do should increase your heart rate and make you sweat. This is known as moderate-intensity exercise. ? Try to do strengthening exercises at least twice each week. Do these in addition to the moderate-intensity exercise.  Know your numbers.Ask your health care provider to check your cholesterol and your blood glucose. Continue to have your blood tested as directed by your health care provider.  What should I know about cancer screening? There are several types of cancer. Take the following steps to reduce your risk and to catch any cancer development as early as possible. Breast Cancer  Practice breast self-awareness. ? This means understanding how your breasts normally appear and feel. ? It also means doing regular breast self-exams. Let your health care provider know about any changes, no matter how small.  If you are 40  or older, have a clinician do a breast exam (clinical breast exam or CBE) every year. Depending on your age, family history, and medical history, it may be recommended that you also have a yearly breast X-ray (mammogram).  If you  have a family history of breast cancer, talk with your health care provider about genetic screening.  If you are at high risk for breast cancer, talk with your health care provider about having an MRI and a mammogram every year.  Breast cancer (BRCA) gene test is recommended for women who have family members with BRCA-related cancers. Results of the assessment will determine the need for genetic counseling and BRCA1 and for BRCA2 testing. BRCA-related cancers include these types: ? Breast. This occurs in males or females. ? Ovarian. ? Tubal. This may also be called fallopian tube cancer. ? Cancer of the abdominal or pelvic lining (peritoneal cancer). ? Prostate. ? Pancreatic.  Cervical, Uterine, and Ovarian Cancer Your health care provider may recommend that you be screened regularly for cancer of the pelvic organs. These include your ovaries, uterus, and vagina. This screening involves a pelvic exam, which includes checking for microscopic changes to the surface of your cervix (Pap test).  For women ages 21-65, health care providers may recommend a pelvic exam and a Pap test every three years. For women ages 79-65, they may recommend the Pap test and pelvic exam, combined with testing for human papilloma virus (HPV), every five years. Some types of HPV increase your risk of cervical cancer. Testing for HPV may also be done on women of any age who have unclear Pap test results.  Other health care providers may not recommend any screening for nonpregnant women who are considered low risk for pelvic cancer and have no symptoms. Ask your health care provider if a screening pelvic exam is right for you.  If you have had past treatment for cervical cancer or a condition that could lead to cancer, you need Pap tests and screening for cancer for at least 20 years after your treatment. If Pap tests have been discontinued for you, your risk factors (such as having a new sexual partner) need to be  reassessed to determine if you should start having screenings again. Some women have medical problems that increase the chance of getting cervical cancer. In these cases, your health care provider may recommend that you have screening and Pap tests more often.  If you have a family history of uterine cancer or ovarian cancer, talk with your health care provider about genetic screening.  If you have vaginal bleeding after reaching menopause, tell your health care provider.  There are currently no reliable tests available to screen for ovarian cancer.  Lung Cancer Lung cancer screening is recommended for adults 69-62 years old who are at high risk for lung cancer because of a history of smoking. A yearly low-dose CT scan of the lungs is recommended if you:  Currently smoke.  Have a history of at least 30 pack-years of smoking and you currently smoke or have quit within the past 15 years. A pack-year is smoking an average of one pack of cigarettes per day for one year.  Yearly screening should:  Continue until it has been 15 years since you quit.  Stop if you develop a health problem that would prevent you from having lung cancer treatment.  Colorectal Cancer  This type of cancer can be detected and can often be prevented.  Routine colorectal cancer screening usually begins at  age 42 and continues through age 45.  If you have risk factors for colon cancer, your health care provider may recommend that you be screened at an earlier age.  If you have a family history of colorectal cancer, talk with your health care provider about genetic screening.  Your health care provider may also recommend using home test kits to check for hidden blood in your stool.  A small camera at the end of a tube can be used to examine your colon directly (sigmoidoscopy or colonoscopy). This is done to check for the earliest forms of colorectal cancer.  Direct examination of the colon should be repeated every  5-10 years until age 71. However, if early forms of precancerous polyps or small growths are found or if you have a family history or genetic risk for colorectal cancer, you may need to be screened more often.  Skin Cancer  Check your skin from head to toe regularly.  Monitor any moles. Be sure to tell your health care provider: ? About any new moles or changes in moles, especially if there is a change in a mole's shape or color. ? If you have a mole that is larger than the size of a pencil eraser.  If any of your family members has a history of skin cancer, especially at a young age, talk with your health care provider about genetic screening.  Always use sunscreen. Apply sunscreen liberally and repeatedly throughout the day.  Whenever you are outside, protect yourself by wearing long sleeves, pants, a wide-brimmed hat, and sunglasses.  What should I know about osteoporosis? Osteoporosis is a condition in which bone destruction happens more quickly than new bone creation. After menopause, you may be at an increased risk for osteoporosis. To help prevent osteoporosis or the bone fractures that can happen because of osteoporosis, the following is recommended:  If you are 46-71 years old, get at least 1,000 mg of calcium and at least 600 mg of vitamin D per day.  If you are older than age 55 but younger than age 65, get at least 1,200 mg of calcium and at least 600 mg of vitamin D per day.  If you are older than age 54, get at least 1,200 mg of calcium and at least 800 mg of vitamin D per day.  Smoking and excessive alcohol intake increase the risk of osteoporosis. Eat foods that are rich in calcium and vitamin D, and do weight-bearing exercises several times each week as directed by your health care provider. What should I know about how menopause affects my mental health? Depression may occur at any age, but it is more common as you become older. Common symptoms of depression  include:  Low or sad mood.  Changes in sleep patterns.  Changes in appetite or eating patterns.  Feeling an overall lack of motivation or enjoyment of activities that you previously enjoyed.  Frequent crying spells.  Talk with your health care provider if you think that you are experiencing depression. What should I know about immunizations? It is important that you get and maintain your immunizations. These include:  Tetanus, diphtheria, and pertussis (Tdap) booster vaccine.  Influenza every year before the flu season begins.  Pneumonia vaccine.  Shingles vaccine.  Your health care provider may also recommend other immunizations. This information is not intended to replace advice given to you by your health care provider. Make sure you discuss any questions you have with your health care provider. Document Released: 03/27/2005  Document Revised: 08/23/2015 Document Reviewed: 11/06/2014 Elsevier Interactive Patient Education  2018 Elsevier Inc.  

## 2017-03-08 ENCOUNTER — Ambulatory Visit
Admission: RE | Admit: 2017-03-08 | Discharge: 2017-03-08 | Disposition: A | Discharge status: Home / Self Care | Payer: 59 | Source: Ambulatory Visit | Attending: Women's Health | Admitting: Women's Health

## 2017-03-08 ENCOUNTER — Other Ambulatory Visit: Payer: Self-pay | Admitting: Women's Health

## 2017-03-08 DIAGNOSIS — Z139 Encounter for screening, unspecified: Secondary | ICD-10-CM

## 2018-01-28 ENCOUNTER — Other Ambulatory Visit: Payer: Self-pay | Admitting: Women's Health

## 2018-01-28 DIAGNOSIS — Z1231 Encounter for screening mammogram for malignant neoplasm of breast: Secondary | ICD-10-CM

## 2018-03-08 ENCOUNTER — Encounter: Payer: Self-pay | Admitting: Women's Health

## 2018-03-08 ENCOUNTER — Ambulatory Visit (INDEPENDENT_AMBULATORY_CARE_PROVIDER_SITE_OTHER): Payer: 59 | Admitting: Women's Health

## 2018-03-08 VITALS — BP 120/78 | Ht 64.0 in | Wt 150.0 lb

## 2018-03-08 DIAGNOSIS — Z13820 Encounter for screening for osteoporosis: Secondary | ICD-10-CM | POA: Diagnosis not present

## 2018-03-08 DIAGNOSIS — Z01419 Encounter for gynecological examination (general) (routine) without abnormal findings: Secondary | ICD-10-CM | POA: Diagnosis not present

## 2018-03-08 DIAGNOSIS — F5101 Primary insomnia: Secondary | ICD-10-CM | POA: Diagnosis not present

## 2018-03-08 MED ORDER — ZOLPIDEM TARTRATE 10 MG PO TABS: 10.0000 mg | ORAL_TABLET | Freq: Every evening | ORAL | 3 refills | Status: DC | PRN

## 2018-03-08 NOTE — Progress Notes (Signed)
Lori Wilcox [birth year] [id number]    History:    Presents for annual exam.  Postmenopausal on no HRT with no bleeding.  Not sexually active in several years.  2017 benign colon polyp.  Normal Pap and mammogram history.  2018+1.2 at spine, -1.6 at hip FRAX 5.8% / 0.4%.  Foot injury November from riding.  Past medical history, past surgical history, family history and social history were all reviewed and documented in the EPIC chart.  CFO/accountant for a company.  Has 3 horses, Sophie 89-month-old not riding yet other horse has Lyme disease.  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  Mother deceased from  metastatic cancer, father 14 healthy.  ROS:  A ROS was performed and pertinent positives and negatives are included.  Exam:  Vitals:   03/08/18 1131  BP: 120/78  Weight: 150 lb (68 kg)  Height: 5\' 4"  (1.626 m)   Body mass index is 25.75 kg/m.   General appearance:  Normal Thyroid:  Symmetrical, normal in size, without palpable masses or nodularity. Respiratory  Auscultation:  Clear without wheezing or rhonchi Cardiovascular  Auscultation:  Regular rate, without rubs, murmurs or gallops  Edema/varicosities:  Not grossly evident Abdominal  Soft,nontender, without masses, guarding or rebound.  Liver/spleen:  No organomegaly noted  Hernia:  None appreciated  Skin  Inspection:  Grossly normal   Breasts: Examined lying and sitting.     Right: Without masses, retractions, discharge or axillary adenopathy.     Left: Without masses, retractions, discharge or axillary adenopathy. Gentitourinary   Inguinal/mons:  Normal without inguinal adenopathy  External genitalia:  Normal  BUS/Urethra/Skene's glands:  Normal  Vagina: Atrophic  Cervix:  Normal  Uterus:  normal in size, shape and contour.  Midline and mobile  Adnexa/parametria:     Rt: Without masses or tenderness.   Lt: Without masses or tenderness.  Anus and perineum: Normal  Digital rectal exam: Normal sphincter tone  without palpated masses or tenderness  Assessment/Plan:  58 y.o. S WF G0 for annual exam with no complaints.  Postmenopausal/no HRT/no bleeding Osteopenia without elevated FRAX Labs-primary care  Plan: Repeat DEXA, will schedule.  Home safety, fall prevention and importance of weightbearing exercise reviewed.  SBEs, continue annual screening mammogram has scheduled.  Continue healthy lifestyle of healthy diet and exercise.  Ambien 10 mg at bedtime as needed aware of addictive properties encouraged to use sparingly, reports use less than 1 prescription in the past year.  Pap normal 2018, new screening guidelines reviewed.    Huel Cote [facility], 1:02 PM 03/08/2018

## 2018-03-08 NOTE — Patient Instructions (Signed)
Health Maintenance for Postmenopausal Women Menopause is a normal process in which your reproductive ability comes to an end. This process happens gradually over a span of months to years, usually between the ages of 62 and 89. Menopause is complete when you have missed 12 consecutive menstrual periods. It is important to talk with your health care provider about some of the most common conditions that affect postmenopausal women, such as heart disease, cancer, and bone loss (osteoporosis). Adopting a healthy lifestyle and getting preventive care can help to promote your health and wellness. Those actions can also lower your chances of developing some of these common conditions. What should I know about menopause? During menopause, you may experience a number of symptoms, such as:  Moderate-to-severe hot flashes.  Night sweats.  Decrease in sex drive.  Mood swings.  Headaches.  Tiredness.  Irritability.  Memory problems.  Insomnia. Choosing to treat or not to treat menopausal changes is an individual decision that you make with your health care provider. What should I know about hormone replacement therapy and supplements? Hormone therapy products are effective for treating symptoms that are associated with menopause, such as hot flashes and night sweats. Hormone replacement carries certain risks, especially as you become older. If you are thinking about using estrogen or estrogen with progestin treatments, discuss the benefits and risks with your health care provider. What should I know about heart disease and stroke? Heart disease, heart attack, and stroke become more likely as you age. This may be due, in part, to the hormonal changes that your body experiences during menopause. These can affect how your body processes dietary fats, triglycerides, and cholesterol. Heart attack and stroke are both medical emergencies. There are many things that you can do to help prevent heart disease  and stroke:  Have your blood pressure checked at least every 1-2 years. High blood pressure causes heart disease and increases the risk of stroke.  If you are 79-72 years old, ask your health care provider if you should take aspirin to prevent a heart attack or a stroke.  Do not use any tobacco products, including cigarettes, chewing tobacco, or electronic cigarettes. If you need help quitting, ask your health care provider.  It is important to eat a healthy diet and maintain a healthy weight. ? Be sure to include plenty of vegetables, fruits, low-fat dairy products, and lean protein. ? Avoid eating foods that are high in solid fats, added sugars, or salt (sodium).  Get regular exercise. This is one of the most important things that you can do for your health. ? Try to exercise for at least 150 minutes each week. The type of exercise that you do should increase your heart rate and make you sweat. This is known as moderate-intensity exercise. ? Try to do strengthening exercises at least twice each week. Do these in addition to the moderate-intensity exercise.  Know your numbers.Ask your health care provider to check your cholesterol and your blood glucose. Continue to have your blood tested as directed by your health care provider.  What should I know about cancer screening? There are several types of cancer. Take the following steps to reduce your risk and to catch any cancer development as early as possible. Breast Cancer  Practice breast self-awareness. ? This means understanding how your breasts normally appear and feel. ? It also means doing regular breast self-exams. Let your health care provider know about any changes, no matter how small.  If you are 40 or  older, have a clinician do a breast exam (clinical breast exam or CBE) every year. Depending on your age, family history, and medical history, it may be recommended that you also have a yearly breast X-ray (mammogram).  If you  have a family history of breast cancer, talk with your health care provider about genetic screening.  If you are at high risk for breast cancer, talk with your health care provider about having an MRI and a mammogram every year.  Breast cancer (BRCA) gene test is recommended for women who have family members with BRCA-related cancers. Results of the assessment will determine the need for genetic counseling and BRCA1 and for BRCA2 testing. BRCA-related cancers include these types: ? Breast. This occurs in males or females. ? Ovarian. ? Tubal. This may also be called fallopian tube cancer. ? Cancer of the abdominal or pelvic lining (peritoneal cancer). ? Prostate. ? Pancreatic. Cervical, Uterine, and Ovarian Cancer Your health care provider may recommend that you be screened regularly for cancer of the pelvic organs. These include your ovaries, uterus, and vagina. This screening involves a pelvic exam, which includes checking for microscopic changes to the surface of your cervix (Pap test).  For women ages 21-65, health care providers may recommend a pelvic exam and a Pap test every three years. For women ages 39-65, they may recommend the Pap test and pelvic exam, combined with testing for human papilloma virus (HPV), every five years. Some types of HPV increase your risk of cervical cancer. Testing for HPV may also be done on women of any age who have unclear Pap test results.  Other health care providers may not recommend any screening for nonpregnant women who are considered low risk for pelvic cancer and have no symptoms. Ask your health care provider if a screening pelvic exam is right for you.  If you have had past treatment for cervical cancer or a condition that could lead to cancer, you need Pap tests and screening for cancer for at least 20 years after your treatment. If Pap tests have been discontinued for you, your risk factors (such as having a new sexual partner) need to be reassessed  to determine if you should start having screenings again. Some women have medical problems that increase the chance of getting cervical cancer. In these cases, your health care provider may recommend that you have screening and Pap tests more often.  If you have a family history of uterine cancer or ovarian cancer, talk with your health care provider about genetic screening.  If you have vaginal bleeding after reaching menopause, tell your health care provider.  There are currently no reliable tests available to screen for ovarian cancer. Lung Cancer Lung cancer screening is recommended for adults 57-50 years old who are at high risk for lung cancer because of a history of smoking. A yearly low-dose CT scan of the lungs is recommended if you:  Currently smoke.  Have a history of at least 30 pack-years of smoking and you currently smoke or have quit within the past 15 years. A pack-year is smoking an average of one pack of cigarettes per day for one year. Yearly screening should:  Continue until it has been 15 years since you quit.  Stop if you develop a health problem that would prevent you from having lung cancer treatment. Colorectal Cancer  This type of cancer can be detected and can often be prevented.  Routine colorectal cancer screening usually begins at age 12 and continues through  age 75.  If you have risk factors for colon cancer, your health care provider may recommend that you be screened at an earlier age.  If you have a family history of colorectal cancer, talk with your health care provider about genetic screening.  Your health care provider may also recommend using home test kits to check for hidden blood in your stool.  A small camera at the end of a tube can be used to examine your colon directly (sigmoidoscopy or colonoscopy). This is done to check for the earliest forms of colorectal cancer.  Direct examination of the colon should be repeated every 5-10 years until  age 75. However, if early forms of precancerous polyps or small growths are found or if you have a family history or genetic risk for colorectal cancer, you may need to be screened more often. Skin Cancer  Check your skin from head to toe regularly.  Monitor any moles. Be sure to tell your health care provider: ? About any new moles or changes in moles, especially if there is a change in a mole's shape or color. ? If you have a mole that is larger than the size of a pencil eraser.  If any of your family members has a history of skin cancer, especially at a Katera Rybka age, talk with your health care provider about genetic screening.  Always use sunscreen. Apply sunscreen liberally and repeatedly throughout the day.  Whenever you are outside, protect yourself by wearing long sleeves, pants, a wide-brimmed hat, and sunglasses. What should I know about osteoporosis? Osteoporosis is a condition in which bone destruction happens more quickly than new bone creation. After menopause, you may be at an increased risk for osteoporosis. To help prevent osteoporosis or the bone fractures that can happen because of osteoporosis, the following is recommended:  If you are 19-50 years old, get at least 1,000 mg of calcium and at least 600 mg of vitamin D per day.  If you are older than age 50 but younger than age 70, get at least 1,200 mg of calcium and at least 600 mg of vitamin D per day.  If you are older than age 70, get at least 1,200 mg of calcium and at least 800 mg of vitamin D per day. Smoking and excessive alcohol intake increase the risk of osteoporosis. Eat foods that are rich in calcium and vitamin D, and do weight-bearing exercises several times each week as directed by your health care provider. What should I know about how menopause affects my mental health? Depression may occur at any age, but it is more common as you become older. Common symptoms of depression include:  Low or sad  mood.  Changes in sleep patterns.  Changes in appetite or eating patterns.  Feeling an overall lack of motivation or enjoyment of activities that you previously enjoyed.  Frequent crying spells. Talk with your health care provider if you think that you are experiencing depression. What should I know about immunizations? It is important that you get and maintain your immunizations. These include:  Tetanus, diphtheria, and pertussis (Tdap) booster vaccine.  Influenza every year before the flu season begins.  Pneumonia vaccine.  Shingles vaccine. Your health care provider may also recommend other immunizations. This information is not intended to replace advice given to you by your health care provider. Make sure you discuss any questions you have with your health care provider. Document Released: 03/27/2005 Document Revised: 08/23/2015 Document Reviewed: 11/06/2014 Elsevier Interactive Patient Education    2019 [place]. Insomnia Insomnia is a sleep disorder that makes it difficult to fall asleep or stay asleep. Insomnia can cause fatigue, low energy, difficulty concentrating, mood swings, and poor performance at work or school. There are three different ways to classify insomnia:  Difficulty falling asleep.  Difficulty staying asleep.  Waking up too early in the morning. Any type of insomnia can be long-term (chronic) or short-term (acute). Both are common. Short-term insomnia usually lasts for three months or less. Chronic insomnia occurs at least three times a week for longer than three months. What are the causes? Insomnia may be caused by another condition, situation, or substance, such as:  Anxiety.  Certain medicines.  Gastroesophageal reflux disease (GERD) or other gastrointestinal conditions.  Asthma or other breathing conditions.  Restless legs syndrome, sleep apnea, or other sleep disorders.  Chronic pain.  Menopause.  Stroke.  Abuse of alcohol,  tobacco, or illegal drugs.  Mental health conditions, such as depression.  Caffeine.  Neurological disorders, such as Alzheimer's disease.  An overactive thyroid (hyperthyroidism). Sometimes, the cause of insomnia may not be known. What increases the risk? Risk factors for insomnia include:  Gender. Women are affected more often than men.  Age. Insomnia is more common as you get older.  Stress.  Lack of exercise.  Irregular work schedule or working night shifts.  Traveling between different time zones.  Certain medical and mental health conditions. What are the signs or symptoms? If you have insomnia, the main symptom is having trouble falling asleep or having trouble staying asleep. This may lead to other symptoms, such as:  Feeling fatigued or having low energy.  Feeling nervous about going to sleep.  Not feeling rested in the morning.  Having trouble concentrating.  Feeling irritable, anxious, or depressed. How is this diagnosed? This condition may be diagnosed based on:  Your symptoms and medical history. Your health care provider may ask about: ? Your sleep habits. ? Any medical conditions you have. ? Your mental health.  A physical exam. How is this treated? Treatment for insomnia depends on the cause. Treatment may focus on treating an underlying condition that is causing insomnia. Treatment may also include:  Medicines to help you sleep.  Counseling or therapy.  Lifestyle adjustments to help you sleep better. Follow these instructions at home: Eating and drinking   Limit or avoid alcohol, caffeinated beverages, and cigarettes, especially close to bedtime. These can disrupt your sleep.  Do not eat a large meal or eat spicy foods right before bedtime. This can lead to digestive discomfort that can make it hard for you to sleep. Sleep habits   Keep a sleep diary to help you and your health care provider figure out what could be causing your  insomnia. Write down: ? When you sleep. ? When you wake up during the night. ? How well you sleep. ? How rested you feel the next day. ? Any side effects of medicines you are taking. ? What you eat and drink.  Make your bedroom a dark, comfortable place where it is easy to fall asleep. ? Put up shades or blackout curtains to block light from outside. ? Use a white noise machine to block noise. ? Keep the temperature cool.  Limit screen use before bedtime. This includes: ? Watching TV. ? Using your smartphone, tablet, or computer.  Stick to a routine that includes going to bed and waking up at the same times every day and night. This can help you  fall asleep faster. Consider making a quiet activity, such as reading, part of your nighttime routine.  Try to avoid taking naps during the day so that you sleep better at night.  Get out of bed if you are still awake after 15 minutes of trying to sleep. Keep the lights down, but try reading or doing a quiet activity. When you feel sleepy, go back to bed. General instructions  Take over-the-counter and prescription medicines only as told by your health care provider.  Exercise regularly, as told by your health care provider. Avoid exercise starting several hours before bedtime.  Use relaxation techniques to manage stress. Ask your health care provider to suggest some techniques that may work well for you. These may include: ? Breathing exercises. ? Routines to release muscle tension. ? Visualizing peaceful scenes.  Make sure that you drive carefully. Avoid driving if you feel very sleepy.  Keep all follow-up visits as told by your health care provider. This is important. Contact a health care provider if:  You are tired throughout the day.  You have trouble in your daily routine due to sleepiness.  You continue to have sleep problems, or your sleep problems get worse. Get help right away if:  You have serious thoughts about  hurting yourself or someone else. If you ever feel like you may hurt yourself or others, or have thoughts about taking your own life, get help right away. You can go to your nearest emergency department or call:  Your local emergency services (911 in the [place]  A suicide crisis helpline, such as the [place] at (847)773-0634. This is open 24 hours a day. Summary  Insomnia is a sleep disorder that makes it difficult to fall asleep or stay asleep.  Insomnia can be long-term (chronic) or short-term (acute).  Treatment for insomnia depends on the cause. Treatment may focus on treating an underlying condition that is causing insomnia.  Keep a sleep diary to help you and your health care provider figure out what could be causing your insomnia. This information is not intended to replace advice given to you by your health care provider. Make sure you discuss any questions you have with your health care provider. Document Released: 01/31/2000 Document Revised: 11/12/2016 Document Reviewed: 11/12/2016 Elsevier Interactive Patient Education  2019 [employer].

## 2018-03-11 ENCOUNTER — Ambulatory Visit
Admission: RE | Admit: 2018-03-11 | Discharge: 2018-03-11 | Disposition: A | Discharge status: Home / Self Care | Payer: 59 | Source: Ambulatory Visit | Attending: Women's Health | Admitting: Women's Health

## 2018-03-11 DIAGNOSIS — Z1231 Encounter for screening mammogram for malignant neoplasm of breast: Secondary | ICD-10-CM

## 2018-11-08 ENCOUNTER — Other Ambulatory Visit: Payer: Self-pay | Admitting: Women's Health

## 2018-11-08 ENCOUNTER — Other Ambulatory Visit: Payer: Self-pay

## 2018-11-08 ENCOUNTER — Ambulatory Visit (INDEPENDENT_AMBULATORY_CARE_PROVIDER_SITE_OTHER): Payer: 59

## 2018-11-08 DIAGNOSIS — Z13820 Encounter for screening for osteoporosis: Secondary | ICD-10-CM

## 2018-11-08 DIAGNOSIS — Z780 Asymptomatic menopausal state: Secondary | ICD-10-CM

## 2018-11-08 DIAGNOSIS — M8589 Other specified disorders of bone density and structure, multiple sites: Secondary | ICD-10-CM

## 2018-11-10 ENCOUNTER — Encounter: Payer: Self-pay | Admitting: Gynecology

## 2018-11-15 ENCOUNTER — Encounter: Payer: Self-pay | Admitting: Gynecology

## 2018-11-16 ENCOUNTER — Other Ambulatory Visit: Payer: Self-pay

## 2018-11-16 DIAGNOSIS — F5101 Primary insomnia: Secondary | ICD-10-CM

## 2018-11-16 MED ORDER — ZOLPIDEM TARTRATE 10 MG PO TABS: 10.0000 mg | ORAL_TABLET | Freq: Every evening | ORAL | 1 refills | Status: AC | PRN

## 2018-11-16 NOTE — Telephone Encounter (Signed)
Called into pharmacy

## 2018-11-16 NOTE — Telephone Encounter (Signed)
Okay for refill?  

## 2019-02-01 ENCOUNTER — Other Ambulatory Visit: Payer: Self-pay | Admitting: Women's Health

## 2019-02-01 DIAGNOSIS — Z1231 Encounter for screening mammogram for malignant neoplasm of breast: Secondary | ICD-10-CM

## 2019-03-14 ENCOUNTER — Ambulatory Visit (INDEPENDENT_AMBULATORY_CARE_PROVIDER_SITE_OTHER): Payer: 59 | Admitting: Women's Health

## 2019-03-14 ENCOUNTER — Encounter: Payer: Self-pay | Admitting: Women's Health

## 2019-03-14 ENCOUNTER — Other Ambulatory Visit: Payer: Self-pay

## 2019-03-14 VITALS — BP 115/73 | Ht 65.0 in | Wt 156.4 lb

## 2019-03-14 DIAGNOSIS — Z01419 Encounter for gynecological examination (general) (routine) without abnormal findings: Secondary | ICD-10-CM | POA: Diagnosis not present

## 2019-03-14 NOTE — Patient Instructions (Addendum)
Vit D 2000 iu  Good to see you today! Health Maintenance for Postmenopausal Women Menopause is a normal process in which your ability to get pregnant comes to an end. This process happens slowly over many months or years, usually between the ages of 25 and 31. Menopause is complete when you have missed your menstrual periods for 12 months. It is important to talk with your health care provider about some of the most common conditions that affect women after menopause (postmenopausal women). These include heart disease, cancer, and bone loss (osteoporosis). Adopting a healthy lifestyle and getting preventive care can help to promote your health and wellness. The actions you take can also lower your chances of developing some of these common conditions. What should I know about menopause? During menopause, you may get a number of symptoms, such as:  Hot flashes. These can be moderate or severe.  Night sweats.  Decrease in sex drive.  Mood swings.  Headaches.  Tiredness.  Irritability.  Memory problems.  Insomnia. Choosing to treat or not to treat these symptoms is a decision that you make with your health care provider. Do I need hormone replacement therapy?  Hormone replacement therapy is effective in treating symptoms that are caused by menopause, such as hot flashes and night sweats.  Hormone replacement carries certain risks, especially as you become older. If you are thinking about using estrogen or estrogen with progestin, discuss the benefits and risks with your health care provider. What is my risk for heart disease and stroke? The risk of heart disease, heart attack, and stroke increases as you age. One of the causes may be a change in the body's hormones during menopause. This can affect how your body uses dietary fats, triglycerides, and cholesterol. Heart attack and stroke are medical emergencies. There are many things that you can do to help prevent heart disease and  stroke. Watch your blood pressure  High blood pressure causes heart disease and increases the risk of stroke. This is more likely to develop in people who have high blood pressure readings, are of African descent, or are overweight.  Have your blood pressure checked: ? Every 3-5 years if you are 80-42 years of age. ? Every year if you are 69 years old or older. Eat a healthy diet   Eat a diet that includes plenty of vegetables, fruits, low-fat dairy products, and lean protein.  Do not eat a lot of foods that are high in solid fats, added sugars, or sodium. Get regular exercise Get regular exercise. This is one of the most important things you can do for your health. Most adults should:  Try to exercise for at least 150 minutes each week. The exercise should increase your heart rate and make you sweat (moderate-intensity exercise).  Try to do strengthening exercises at least twice each week. Do these in addition to the moderate-intensity exercise.  Spend less time sitting. Even light physical activity can be beneficial. Other tips  Work with your health care provider to achieve or maintain a healthy weight.  Do not use any products that contain nicotine or tobacco, such as cigarettes, e-cigarettes, and chewing tobacco. If you need help quitting, ask your health care provider.  Know your numbers. Ask your health care provider to check your cholesterol and your blood sugar (glucose). Continue to have your blood tested as directed by your health care provider. Do I need screening for cancer? Depending on your health history and family history, you may need to  have cancer screening at different stages of your life. This may include screening for:  Breast cancer.  Cervical cancer.  Lung cancer.  Colorectal cancer. What is my risk for osteoporosis? After menopause, you may be at increased risk for osteoporosis. Osteoporosis is a condition in which bone destruction happens more  quickly than new bone creation. To help prevent osteoporosis or the bone fractures that can happen because of osteoporosis, you may take the following actions:  If you are 19-50 years old, get at least 1,000 mg of calcium and at least 600 mg of vitamin D per day.  If you are older than age 50 but younger than age 70, get at least 1,200 mg of calcium and at least 600 mg of vitamin D per day.  If you are older than age 70, get at least 1,200 mg of calcium and at least 800 mg of vitamin D per day. Smoking and drinking excessive alcohol increase the risk of osteoporosis. Eat foods that are rich in calcium and vitamin D, and do weight-bearing exercises several times each week as directed by your health care provider. How does menopause affect my mental health? Depression may occur at any age, but it is more common as you become older. Common symptoms of depression include:  Low or sad mood.  Changes in sleep patterns.  Changes in appetite or eating patterns.  Feeling an overall lack of motivation or enjoyment of activities that you previously enjoyed.  Frequent crying spells. Talk with your health care provider if you think that you are experiencing depression. General instructions See your health care provider for regular wellness exams and vaccines. This may include:  Scheduling regular health, dental, and eye exams.  Getting and maintaining your vaccines. These include: ? Influenza vaccine. Get this vaccine each year before the flu season begins. ? Pneumonia vaccine. ? Shingles vaccine. ? Tetanus, diphtheria, and pertussis (Tdap) booster vaccine. Your health care provider may also recommend other immunizations. Tell your health care provider if you have ever been abused or do not feel safe at home. Summary  Menopause is a normal process in which your ability to get pregnant comes to an end.  This condition causes hot flashes, night sweats, decreased interest in sex, mood swings,  headaches, or lack of sleep.  Treatment for this condition may include hormone replacement therapy.  Take actions to keep yourself healthy, including exercising regularly, eating a healthy diet, watching your weight, and checking your blood pressure and blood sugar levels.  Get screened for cancer and depression. Make sure that you are up to date with all your vaccines. This information is not intended to replace advice given to you by your health care provider. Make sure you discuss any questions you have with your health care provider. Document Revised: 01/26/2018 Document Reviewed: 01/26/2018 Elsevier Patient Education  2020 [employer]  

## 2019-03-14 NOTE — Progress Notes (Signed)
Lori Wilcox 1960/06/25 [id number]    History:    Presents for annual exam.  Postmenopausal no HRT with no bleeding.  Not sexually active.  Normal Pap and mammogram history.  10/2018 T score -1.4 FRAX 7% / 0.5%.  2017 benign colon polyps 5-year follow-up.  Has had Shingrix.  Past medical history, past surgical history, family history and social history were all reviewed and documented in the EPIC chart.  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  Mother deceased from metastatic cancer.  Father 33 doing well.  ROS:  A ROS was performed and pertinent positives and negatives are included.  Exam:  Vitals:   03/14/19 1155  BP: 115/73  Weight: 156 lb 6.4 oz (70.9 kg)  Height: 5\' 5"  (1.651 m)   Body mass index is 26.03 kg/m.   General appearance:  Normal Thyroid:  Symmetrical, normal in size, without palpable masses or nodularity. Respiratory  Auscultation:  Clear without wheezing or rhonchi Cardiovascular  Auscultation:  Regular rate, without rubs, murmurs or gallops  Edema/varicosities:  Not grossly evident Abdominal  Soft,nontender, without masses, guarding or rebound.  Liver/spleen:  No organomegaly noted  Hernia:  None appreciated  Skin  Inspection:  Grossly normal   Breasts: Examined lying and sitting.     Right: Without masses, retractions, discharge or axillary adenopathy.     Left: Without masses, retractions, discharge or axillary adenopathy. Gentitourinary   Inguinal/mons:  Normal without inguinal adenopathy  External genitalia:  Normal  BUS/Urethra/Skene's glands:  Normal  Vagina: Mild vaginal atrophy   Cervix:  Normal  Uterus:  normal in size, shape and contour.  Midline and mobile  Adnexa/parametria:     Rt: Without masses or tenderness.   Lt: Without masses or tenderness.  Anus and perineum: Normal  Digital rectal exam: Normal sphincter tone without palpated masses or tenderness  Assessment/Plan:  59 y.o. S WF G0 for annual exam with no  complaints.  Postmenopausal/no HRT/no bleeding/not sexually active Osteopenia without elevated FRAX Labs-primary care  Plan: Reviewed vaginal lubricants/condoms if becomes sexually active.  SBEs, continue annual 3D screening mammogram, calcium rich foods, vitamin D 2000 IUs daily.  Aware repeat colonoscopy 2022.  Aware of importance of weightbearing and balance type exercise, yoga encouraged.  Does wear a helmet in the past when riding.  Home safety, fall prevention discussed.  Pap normal 2018, new screening guidelines reviewed.    [place], 1:07 PM 03/14/2019

## 2019-03-22 ENCOUNTER — Ambulatory Visit
Admission: RE | Admit: 2019-03-22 | Discharge: 2019-03-22 | Disposition: A | Discharge status: Home / Self Care | Payer: 59 | Source: Ambulatory Visit | Attending: Women's Health | Admitting: Women's Health

## 2019-03-22 ENCOUNTER — Other Ambulatory Visit: Payer: Self-pay

## 2019-03-22 DIAGNOSIS — Z1231 Encounter for screening mammogram for malignant neoplasm of breast: Secondary | ICD-10-CM

## 2020-02-12 ENCOUNTER — Other Ambulatory Visit: Payer: Self-pay | Admitting: Nurse Practitioner

## 2020-02-12 DIAGNOSIS — Z1231 Encounter for screening mammogram for malignant neoplasm of breast: Secondary | ICD-10-CM

## 2020-03-19 ENCOUNTER — Encounter: Payer: Self-pay | Admitting: Nurse Practitioner

## 2020-03-19 ENCOUNTER — Ambulatory Visit (INDEPENDENT_AMBULATORY_CARE_PROVIDER_SITE_OTHER): Payer: No Typology Code available for payment source | Admitting: Nurse Practitioner

## 2020-03-19 ENCOUNTER — Encounter: Payer: 59 | Admitting: Nurse Practitioner

## 2020-03-19 ENCOUNTER — Other Ambulatory Visit: Payer: Self-pay

## 2020-03-19 VITALS — BP 118/80 | Ht 65.0 in | Wt 158.0 lb

## 2020-03-19 DIAGNOSIS — M8589 Other specified disorders of bone density and structure, multiple sites: Secondary | ICD-10-CM

## 2020-03-19 DIAGNOSIS — Z01419 Encounter for gynecological examination (general) (routine) without abnormal findings: Secondary | ICD-10-CM | POA: Diagnosis not present

## 2020-03-19 NOTE — Progress Notes (Signed)
   TRYSTYN SITTS [birth year] [id number]   History:  60 y.o. G0 presents for annual exam. Postmenopausal - no HRT, no bleeding. Normal pap and mammogram history. Mother diagnosed with breast cancer at 22. Osteopenia. History of Vitamin D deficiency. Not currently sexually active.   Gynecologic History Patient's last menstrual period was 02/01/2012.   Contraception/Family planning: post menopausal status  Health Maintenance Last Pap: 02/27/2016. Results were: normal Last mammogram: 03/22/2019. Results were: normal Last colonoscopy: 2017. Results were: polyp, 5 year recall Last Dexa: 11/08/2018. Results were: t-score -1.4, FRAX 7.1% / 0.5%  Past medical history, past surgical history, family history and social history were all reviewed and documented in the EPIC chart.  ROS:  A ROS was performed and pertinent positives and negatives are included.  Exam:  Vitals:   03/19/20 1145  BP: 118/80  Weight: 158 lb (71.7 kg)  Height: 5\' 5"  (1.651 m)   Body mass index is 26.29 kg/m.  General appearance:  Normal Thyroid:  Symmetrical, normal in size, without palpable masses or nodularity. Respiratory  Auscultation:  Clear without wheezing or rhonchi Cardiovascular  Auscultation:  Regular rate, without rubs, murmurs or gallops  Edema/varicosities:  Not grossly evident Abdominal  Soft,nontender, without masses, guarding or rebound.  Liver/spleen:  No organomegaly noted  Hernia:  None appreciated  Skin  Inspection:  Grossly normal   Breasts: Examined lying and sitting.   Right: Without masses, retractions, discharge or axillary adenopathy.   Left: Without masses, retractions, discharge or axillary adenopathy. Gentitourinary   Inguinal/mons:  Normal without inguinal adenopathy  External genitalia:  Normal  BUS/Urethra/Skene's glands:  Normal  Vagina:  Normal  Cervix:  Normal  Uterus:  Normal in size, shape and contour.  Midline and mobile  Adnexa/parametria:     Rt: Without masses or  tenderness.   Lt: Without masses or tenderness.  Anus and perineum: Normal  Digital rectal exam: Normal sphincter tone without palpated masses or tenderness  Assessment/Plan:  60 y.o. G0 for annual exam.   Well female exam with routine gynecological exam - Education provided on SBEs, importance of preventative screenings, current guidelines, high calcium diet, regular exercise, and multivitamin daily. Sees PCP soon and plans to have fasting lab work with them.   Osteopenia of multiple sites - 10/2018 T-score -1.4 without elevated FRAX. Recommend taking Vitamin D supplement consistently and eating a high calcium diet. Also recommend regular exercise with resistance training.   Screening for cervical cancer - Normal Pap history.  Will repeat at 5-year interval per guidelines.  Screening for breast cancer - Normal mammogram history.  Continue annual screenings.  Normal breast exam today. Mammogram scheduled for next month.  Screening for colon cancer - 2017 colonoscopy. Will repeat this year at GI's recommended interval.   Follow up in 1 year for annual.     Tamela Gammon [facility], 12:01 PM 03/19/2020

## 2020-03-19 NOTE — Patient Instructions (Signed)

## 2020-03-22 ENCOUNTER — Ambulatory Visit: Payer: 59

## 2020-04-30 ENCOUNTER — Ambulatory Visit: Payer: 59

## 2020-06-26 ENCOUNTER — Ambulatory Visit
Admission: RE | Admit: 2020-06-26 | Discharge: 2020-06-26 | Disposition: A | Discharge status: Home / Self Care | Payer: No Typology Code available for payment source | Source: Ambulatory Visit | Attending: Nurse Practitioner | Admitting: Nurse Practitioner

## 2020-06-26 ENCOUNTER — Other Ambulatory Visit: Payer: Self-pay

## 2020-06-26 DIAGNOSIS — Z1231 Encounter for screening mammogram for malignant neoplasm of breast: Secondary | ICD-10-CM

## 2020-06-27 ENCOUNTER — Other Ambulatory Visit: Payer: Self-pay | Admitting: Orthopaedic Surgery

## 2020-06-27 DIAGNOSIS — M545 Low back pain, unspecified: Secondary | ICD-10-CM

## 2020-07-08 ENCOUNTER — Other Ambulatory Visit: Payer: Self-pay | Admitting: *Deleted

## 2020-07-08 DIAGNOSIS — M8580 Other specified disorders of bone density and structure, unspecified site: Secondary | ICD-10-CM

## 2020-08-06 ENCOUNTER — Other Ambulatory Visit: Payer: Self-pay | Admitting: Internal Medicine

## 2020-08-06 DIAGNOSIS — K769 Liver disease, unspecified: Secondary | ICD-10-CM

## 2020-08-13 ENCOUNTER — Ambulatory Visit
Admission: RE | Admit: 2020-08-13 | Discharge: 2020-08-13 | Disposition: A | Discharge status: Home / Self Care | Payer: No Typology Code available for payment source | Source: Ambulatory Visit | Attending: Internal Medicine | Admitting: Internal Medicine

## 2020-08-13 DIAGNOSIS — K769 Liver disease, unspecified: Secondary | ICD-10-CM

## 2020-11-12 ENCOUNTER — Other Ambulatory Visit: Payer: Self-pay

## 2020-11-12 ENCOUNTER — Ambulatory Visit (INDEPENDENT_AMBULATORY_CARE_PROVIDER_SITE_OTHER): Payer: No Typology Code available for payment source

## 2020-11-12 ENCOUNTER — Other Ambulatory Visit: Payer: Self-pay | Admitting: Obstetrics & Gynecology

## 2020-11-12 DIAGNOSIS — M8589 Other specified disorders of bone density and structure, multiple sites: Secondary | ICD-10-CM

## 2020-11-12 DIAGNOSIS — Z780 Asymptomatic menopausal state: Secondary | ICD-10-CM | POA: Diagnosis not present

## 2020-11-12 DIAGNOSIS — M8580 Other specified disorders of bone density and structure, unspecified site: Secondary | ICD-10-CM

## 2021-01-13 ENCOUNTER — Other Ambulatory Visit: Payer: Self-pay | Admitting: Internal Medicine

## 2021-01-13 DIAGNOSIS — G459 Transient cerebral ischemic attack, unspecified: Secondary | ICD-10-CM

## 2021-01-21 ENCOUNTER — Ambulatory Visit
Admission: RE | Admit: 2021-01-21 | Discharge: 2021-01-21 | Disposition: A | Discharge status: Home / Self Care | Payer: No Typology Code available for payment source | Source: Ambulatory Visit | Attending: Internal Medicine | Admitting: Internal Medicine

## 2021-01-21 DIAGNOSIS — G459 Transient cerebral ischemic attack, unspecified: Secondary | ICD-10-CM

## 2021-02-19 ENCOUNTER — Ambulatory Visit: Payer: No Typology Code available for payment source | Admitting: Diagnostic Neuroimaging

## 2021-02-21 DIAGNOSIS — G8929 Other chronic pain: Secondary | ICD-10-CM | POA: Insufficient documentation

## 2021-02-21 DIAGNOSIS — J301 Allergic rhinitis due to pollen: Secondary | ICD-10-CM | POA: Insufficient documentation

## 2021-02-21 DIAGNOSIS — N879 Dysplasia of cervix uteri, unspecified: Secondary | ICD-10-CM | POA: Insufficient documentation

## 2021-02-21 DIAGNOSIS — G459 Transient cerebral ischemic attack, unspecified: Secondary | ICD-10-CM | POA: Insufficient documentation

## 2021-02-21 DIAGNOSIS — M222X1 Patellofemoral disorders, right knee: Secondary | ICD-10-CM | POA: Insufficient documentation

## 2021-02-21 DIAGNOSIS — K760 Fatty (change of) liver, not elsewhere classified: Secondary | ICD-10-CM | POA: Insufficient documentation

## 2021-02-21 DIAGNOSIS — M8580 Other specified disorders of bone density and structure, unspecified site: Secondary | ICD-10-CM | POA: Insufficient documentation

## 2021-02-21 DIAGNOSIS — E7800 Pure hypercholesterolemia, unspecified: Secondary | ICD-10-CM | POA: Insufficient documentation

## 2021-02-23 NOTE — Progress Notes (Signed)
care    Cardiology Office Note   Date:  03/04/2021   ID:  ARAIYA TILMON, DOB [birth year], MRN [id number]  PCP:  Lavone Orn, MD  Cardiologist:   Tzipporah Nagorski Martinique, MD   Chief Complaint  Patient presents with   Hyperlipidemia      History of Present Illness: SHAIDA ROUTE is a 61 y.o. female who is seen at the request of Dr Laurann [place] for evaluation of CV risk. She has a history of HTN, HLD, and prior TIA. Modest carotid disease by doppler. She also has a history of statin intolerance. Unable to take low dose lipitor or Crestor due to myalgias and joint pain. She reports 2 years ago she had severe chest pain. Was seen at [facility] and told she did not have an MI. No ischemic work up done. Appears she had an Echo but results cannot be accessed. States she had chest pain off and on for a year that was felt to be GERD.   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 She notes that she gets SOB with farm activities like pushing a wheel barrow. Also notes excessive fatigue. She is trying to eat well and exercise more. She has some orthopedic issues and has had several back injections.   In November she had a TIA. CT and MRI negative except for small vessel disease. Carotid dopplers showed a 50-69% left carotid stenosis.     Past Medical History:  Diagnosis Date   Acid reflux    Broken finger 2005   Carotid artery disease ([place])    Fatigue 09/17/2010   VITAMIN D DEFICIENCY ?DEPRESSION   Hepatic steatosis    Hypercholesteremia    Insomnia    Liver lesion, right lobe    Osteopenia 10/2018   T score -1.4 FRAX 7% / 0.5%   Seasonal allergic rhinitis due to pollen    TIA (transient ischemic attack)     Past Surgical History:  Procedure Laterality Date   GANGLION CYST EXCISION     LAPAROSCOPIC APPENDECTOMY  2000   SHOULDER SURGERY  2006   TMJ ARTHROPLASTY  1986     Current Outpatient Medications  Medication Sig  Dispense Refill   celecoxib (CELEBREX) 200 MG capsule Take 200 mg by mouth daily as needed.     metoprolol tartrate (LOPRESSOR) 100 MG tablet Take 100 mg 2 hours before coronary ct 1 tablet 0   Vitamin Mixture (VITAMIN E COMPLETE) CAPS Take 1 capsule by mouth daily.     zolpidem (AMBIEN) 10 MG tablet Take 1 tablet (10 mg total) by mouth at bedtime as needed. 30 tablet 1   No current facility-administered medications for this visit.    Allergies:   Patient has no known allergies.    Social History:  The patient  reports that she has quit smoking. She has never used smokeless tobacco. She reports current alcohol use. She reports that she does not use drugs.   Family History:  The patient's family history includes Alpha-1 antitrypsin deficiency in her brother; Breast cancer (age of onset: 7) in her mother; COPD in her mother; Colon cancer in her maternal grandfather; Liver disease in her brother; Lung cancer in her mother; Uterine cancer in her maternal grandmother.    ROS:  Please see the history of present illness.   Otherwise, review of systems are positive for none.   All other systems are  reviewed and negative.    PHYSICAL EXAM: VS:  BP 122/62    Pulse 82    Ht 5\' 5"  (1.651 m)    Wt 159 lb (72.1 kg)    LMP 02/01/2012    SpO2 98%    BMI 26.46 kg/m  , BMI Body mass index is 26.46 kg/m. GEN: Well nourished, well developed, in no acute distress HEENT: normal Neck: no JVD, carotid bruits, or masses Cardiac: RRR; no murmurs, rubs, or gallops,no edema  Respiratory:  clear to auscultation bilaterally, normal work of breathing GI: soft, nontender, nondistended, + BS MS: no deformity or atrophy Skin: warm and dry, no rash Neuro:  Strength and sensation are intact Psych: euthymic mood, full affect   EKG:  EKG is ordered today. The ekg ordered today demonstrates NSR rate 82. Otherwise normal. I have personally reviewed and interpreted this study.    Recent Labs: No results found for  requested labs within last 8760 hours.    Lipid Panel    Component Value Date/Time   CHOL 219 (H) 03/02/2017 1225   TRIG 57 03/02/2017 1225   HDL 88 03/02/2017 1225   CHOLHDL 2.5 03/02/2017 1225   VLDL 12 04/28/2016 1146   LDLCALC 117 (H) 03/02/2017 1225   Dated 01/08/21: cholesterol 283, triglycerides 143, HDL 80, LDL 178. CBC, CMET, TSH normal   Wt Readings from Last 3 Encounters:  03/04/21 159 lb (72.1 kg)  03/19/20 158 lb (71.7 kg)  03/14/19 156 lb 6.4 oz (70.9 kg)      Other studies Reviewed: Additional studies/ records that were reviewed today include: as noted in HPI   ASSESSMENT AND PLAN:  1.  DOE and fatigue. These symptoms may be anginal equivalent especially in a female. She did have significant chest pain in 2021 but no ischemic work up done. She is at least intermediate risk with HLD, TIA and carotid disease. Discussed options for ischemic work up including stress testing and CTA. After discussion decided to proceed with coronary CTA.  2. Hyperlipidemia. Intolerant of statins. Last LDL 178. Planning to repeat labs tomorrow. She is at high risk with target LDL < 70. Unlikely to achieve this with Zetia alone. Intolerant of even low dose statin. Will refer to lipid clinic  3. History of TIA. Continue ASA 81 mg daily 4. Carotid arterial disease. Recommend repeat doppler in one year.    Current medicines are reviewed at length with the patient today.  The patient does not have concerns regarding medicines.  The following changes have been made:  no change  Labs/ tests ordered today include:   Orders Placed This Encounter  Procedures   CT CORONARY MORPH W/CTA COR W/SCORE W/CA W/CM &/OR WO/CM   Basic metabolic panel   EKG 18-EXHB         Disposition:   FU with me after above studies.   Signed, Taheem Fricke Martin[place], MD  03/04/2021 4:54 PM    [facility] [address] [place], [place], [address] Phone [phone], Fax [phone]

## 2021-03-04 ENCOUNTER — Other Ambulatory Visit: Payer: Self-pay

## 2021-03-04 ENCOUNTER — Ambulatory Visit (INDEPENDENT_AMBULATORY_CARE_PROVIDER_SITE_OTHER): Payer: No Typology Code available for payment source | Admitting: Cardiology

## 2021-03-04 ENCOUNTER — Encounter: Payer: Self-pay | Admitting: Cardiology

## 2021-03-04 VITALS — BP 122/62 | HR 82 | Ht 65.0 in | Wt 159.0 lb

## 2021-03-04 DIAGNOSIS — R0609 Other forms of dyspnea: Secondary | ICD-10-CM | POA: Diagnosis not present

## 2021-03-04 DIAGNOSIS — G459 Transient cerebral ischemic attack, unspecified: Secondary | ICD-10-CM

## 2021-03-04 DIAGNOSIS — I6521 Occlusion and stenosis of right carotid artery: Secondary | ICD-10-CM

## 2021-03-04 DIAGNOSIS — R5383 Other fatigue: Secondary | ICD-10-CM | POA: Diagnosis not present

## 2021-03-04 DIAGNOSIS — Z8249 Family history of ischemic heart disease and other diseases of the circulatory system: Secondary | ICD-10-CM

## 2021-03-04 DIAGNOSIS — E7800 Pure hypercholesterolemia, unspecified: Secondary | ICD-10-CM | POA: Diagnosis not present

## 2021-03-04 MED ORDER — METOPROLOL TARTRATE 100 MG PO TABS: ORAL_TABLET | ORAL | 0 refills | Status: DC

## 2021-03-04 NOTE — Patient Instructions (Addendum)
Medication Instructions:  Continue same medications   Lab Work: Bmet to be done with Primary Care   Testing/Procedures: Coronary CT will be scheduled after approved by insurance   Follow instructions below   Follow-Up: At [facility], you and your health needs are our priority.  As part of our continuing mission to provide you with exceptional heart care, we have created designated Provider Care Teams.  These Care Teams include your primary Cardiologist (physician) and Advanced Practice Providers (APPs -  Physician Assistants and Nurse Practitioners) who all work together to provide you with the care you need, when you need it.  We recommend signing up for the patient portal called "MyChart".  Sign up information is provided on this After Visit Summary.  MyChart is used to connect with patients for Virtual Visits (Telemedicine).  Patients are able to view lab/test results, encounter notes, upcoming appointments, etc.  Non-urgent messages can be sent to your provider as well.   To learn more about what you can do with MyChart, go to [email or web address].      Schedule appointment with Pharmacist in [place] next appointment:  To Be Determined after test    The format for your next appointment: Office   Provider:  Dr.Jordan      Your cardiac CT will be scheduled at one of the below locations:   [facility] [address] [place], [place] [address] [phone]  [facility] [address] [place], [place] [address] [phone]  If scheduled at [facility], please arrive at the [facility] main entrance (entrance A) of [facility] 30 minutes prior to test start time. You can use the FREE valet parking offered at the main entrance (encouraged to control the heart rate for the test) Proceed to the [facility] Radiology Department (first floor) to check-in and test prep.  If  scheduled at [facility], please arrive 15 mins early for check-in and test prep.  Please follow these instructions carefully (unless otherwise directed):   On the Night Before the Test: Be sure to Drink plenty of water. Do not consume any caffeinated/decaffeinated beverages or chocolate 12 hours prior to your test. Do not take any antihistamines 12 hours prior to your test.   On the Day of the Test: Drink plenty of water until 1 hour prior to the test. Do not eat any food 4 hours prior to the test. You may take your regular medications prior to the test.  Take metoprolol 100 mg two hours prior to test. FEMALES- please wear underwire-free bra if available, avoid dresses & tight clothing          After the Test: Drink plenty of water. After receiving IV contrast, you may experience a mild flushed feeling. This is normal. On occasion, you may experience a mild rash up to 24 hours after the test. This is not dangerous. If this occurs, you can take Benadryl 25 mg and increase your fluid intake. If you experience trouble breathing, this can be serious. If it is severe call 911 IMMEDIATELY. If it is mild, please call our office.  We will call to schedule your test 2-4 weeks out understanding that some insurance companies will need an authorization prior to the service being performed.   For non-scheduling related questions, please contact the cardiac imaging nurse navigator should you have any questions/concerns: Marchia Bond, Cardiac Imaging Nurse Navigator Gordy Clement, Cardiac  Imaging Nurse [place] [facility] Direct Office Dial: [phone]   For scheduling needs, including cancellations and rescheduling, please call [place], [phone].

## 2021-03-05 LAB — BASIC METABOLIC PANEL
BUN/Creatinine Ratio: 21 (ref 12–28)
BUN: 16 mg/dL (ref 8–27)
CO2: 25 mmol/L (ref 20–29)
Calcium: 9.9 mg/dL (ref 8.7–10.3)
Chloride: 106 mmol/L (ref 96–106)
Creatinine, Ser: 0.78 mg/dL (ref 0.57–1.00)
Glucose: 67 mg/dL — ABNORMAL LOW (ref 70–99)
Potassium: 4.7 mmol/L (ref 3.5–5.2)
Sodium: 144 mmol/L (ref 134–144)
eGFR: 87 mL/min/{1.73_m2} (ref >59)

## 2021-03-07 ENCOUNTER — Telehealth: Payer: Self-pay | Admitting: Cardiology

## 2021-03-07 ENCOUNTER — Encounter: Payer: Self-pay | Admitting: *Deleted

## 2021-03-07 NOTE — Telephone Encounter (Signed)
FYI--Patient saw Dr. Martinique on 1/17, but she did not go over her full family hx. She states there were a few things that she found out that she would like to make Dr. Martinique aware of and have updated in her chart. She states her paternal grandfather died in his late 76's of heart failure. She states he was undiagnosed due to chest sounds of bronchitis cancelling out obscured heart sounds. She states her grandfather also had metastatic colon cancer which made if difficult to immediately determine the cause of death at the time. If questions, a call may be returned to discuss.

## 2021-03-07 NOTE — Telephone Encounter (Signed)
Placed information in patient history chart

## 2021-03-11 ENCOUNTER — Encounter: Payer: Self-pay | Admitting: Neurology

## 2021-03-11 ENCOUNTER — Ambulatory Visit (INDEPENDENT_AMBULATORY_CARE_PROVIDER_SITE_OTHER): Payer: No Typology Code available for payment source | Admitting: Neurology

## 2021-03-11 ENCOUNTER — Telehealth: Payer: Self-pay | Admitting: Neurology

## 2021-03-11 VITALS — BP 137/75 | HR 67 | Ht 65.0 in | Wt 160.2 lb

## 2021-03-11 DIAGNOSIS — R42 Dizziness and giddiness: Secondary | ICD-10-CM

## 2021-03-11 DIAGNOSIS — G459 Transient cerebral ischemic attack, unspecified: Secondary | ICD-10-CM

## 2021-03-11 DIAGNOSIS — E782 Mixed hyperlipidemia: Secondary | ICD-10-CM

## 2021-03-11 NOTE — Telephone Encounter (Signed)
[employer] order sent to GI, They will obtain the auth and reach out to the patient to schedule.

## 2021-03-11 NOTE — Progress Notes (Signed)
[facility] [address] [place]. [place] [address] [phone]       OFFICE CONSULT NOTE  Lori Wilcox Date of Birth:  18-Nov-1960 Medical Record Number:  [id number]   Referring MD: Lavone Orn  Reason for Referral: TIA  HPI: Lori Wilcox is a 61 year old Caucasian lady seen today for initial consultation visit for TIA.  History is obtained from the patient and review of electronic medical records and referral notes.  She has past medical history of hyperlipidemia, acid reflux, insomnia and osteopenia.  Patient was camping outdoors and [facility] November 2022 and was outdoors riding horses.  She developed sudden onset of lightheadedness, blurred vision, feeling of off balance, double vision, nausea, vomiting and some numbness and speech difficulties.  This lasted for 2 to 3 hours.  Her friends initially made her rest in a horse trailer for an hour but when n she did not get better they took her to [facility] in [place].  Patient had noncontrast CAT scan of the head which was unremarkable followed by CT angiogram and CT perfusion which was suboptimal due to timing of dye injection but showed no large vessel stenosis or occlusion.  She had carotid ultrasound also which showed 50% left ICA stenosis but I do not have the actual report.  Subsequently MRI scan of the brain was obtained on 01/03/2021 which showed only white matter changes and no acute abnormality.  Echocardiogram was also done which was unremarkable.  Lipid profile was elevated and she was started on Crestor but she had trouble tolerating it and subsequently switched to Lipitor and had again tolerating that as well and stopped it.  Last lipid profile on 03/06/2021 shows total cholesterol of 238 and LDL 154 and HDL 68 mg percent.  She has been taking aspirin 81 mg daily and tolerating well without bleeding but some bruising.  She has seen cardiology Dr. Martinique who plans to do coronary CTA  and she is also been referred to the lipid clinic to discuss alternative lipid management.  She has no prior history of strokes or TIAs or migraines or seizures or any other neurological problems.  No family history of strokes or heart attacks at a young age.  ROS:   14 system review of systems is positive for dizziness, numbness, imbalance, double vision, nausea, vomiting, bruising all other systems negative  PMH:  Past Medical History:  Diagnosis Date   Acid reflux    Broken finger 2005   Carotid artery disease ([place])    Fatigue 09/17/2010   VITAMIN D DEFICIENCY ?DEPRESSION   Hepatic steatosis    Hypercholesteremia    Insomnia    Liver lesion, right lobe    Osteopenia 10/2018   T score -1.4 FRAX 7% / 0.5%   Seasonal allergic rhinitis due to pollen    TIA (transient ischemic attack)     Social History:  Social History   Socioeconomic History   Marital status: Married    Spouse name: Not on file   Number of children: Not on file   Years of education: Not on file   Highest education level: Not on file  Occupational History   Not on file  Tobacco Use   Smoking status: Former   Smokeless tobacco: Never  Vaping Use   Vaping Use: Never used  Substance and Sexual Activity   Alcohol use: Yes    Alcohol/week: 0.0 standard drinks    Comment: OCCASIONAL SOCIAL   Drug use:  No   Sexual activity: Not Currently    Comment: DENIED INSURANCE QUESTIONS  Other Topics Concern   Not on file  Social History Narrative   CPA/CFO   Social Determinants of Health   Financial Resource Strain: Not on file  Food Insecurity: Not on file  Transportation Needs: Not on file  Physical Activity: Not on file  Stress: Not on file  Social Connections: Not on file  Intimate Partner Violence: Not on file    Medications:   Current Outpatient Medications on File Prior to Visit  Medication Sig Dispense Refill   metoprolol tartrate (LOPRESSOR) 100 MG tablet Take 100 mg 2 hours before coronary ct  1 tablet 0   VITAMIN D, CHOLECALCIFEROL, PO Take 2,000 Int'l Units/day by mouth daily.     Vitamin Mixture (VITAMIN E COMPLETE) CAPS Take 1 capsule by mouth daily.     zolpidem (AMBIEN) 10 MG tablet Take 1 tablet (10 mg total) by mouth at bedtime as needed. 30 tablet 1   No current facility-administered medications on file prior to visit.    Allergies:   Allergies  Allergen Reactions   Atorvastatin Other (See Comments)    Severe headaches, body aches, constipation   Rosuvastatin Other (See Comments)    Severe headaches, body aches, constipation    Physical Exam General: well developed, well nourished pleasant middle-age Caucasian lady, seated, in no evident distress Head: head normocephalic and atraumatic.   Neck: supple with no carotid or supraclavicular bruits Cardiovascular: regular rate and rhythm, no murmurs Musculoskeletal: no deformity Skin:  no rash/petichiae Vascular:  Normal pulses all extremities  Neurologic Exam Mental Status: Awake and fully alert. Oriented to place and time. Recent and remote memory intact. Attention span, concentration and fund of knowledge appropriate. Mood and affect appropriate.  Cranial Nerves: Fundoscopic exam reveals sharp disc margins. Pupils equal, briskly reactive to light. Extraocular movements full without nystagmus. Visual fields full to confrontation. Hearing intact. Facial sensation intact. Face, tongue, palate moves normally and symmetrically.  Motor: Normal bulk and tone. Normal strength in all tested extremity muscles. Sensory.: intact to touch , pinprick , position and vibratory sensation.  Coordination: Rapid alternating movements normal in all extremities. Finger-to-nose and heel-to-shin performed accurately bilaterally. Gait and Station: Arises from chair without difficulty. Stance is normal. Gait demonstrates normal stride length and balance . Able to heel, toe and tandem walk without difficulty.  Reflexes: 1+ and symmetric. Toes  downgoing.   NIHSS  0 Modified Rankin  0   ASSESSMENT: 61 year old Caucasian lady with posterior circulation TIA in November 2022 likely from small vessel disease.  Vascular risk factors of hyperlipidemia and mild extracranial carotid stenosis only     PLAN:I had a long d/w patient about her recent posterior dislocation TIA, risk for recurrent stroke/TIAs, personally independently reviewed imaging studies and stroke evaluation results and answered questions.Continue aspirin 81 mg daily  for secondary stroke prevention and maintain strict control of hypertension with blood pressure goal below 130/90, diabetes with hemoglobin A1c goal below 6.5% and lipids with LDL cholesterol goal below 70 mg/dL. I also advised the patient to eat a healthy diet with plenty of whole grains, cereals, fruits and vegetables, exercise regularly and maintain ideal body weight .I advised the patient to start taking co-Q10 200 mg daily and take full dose statin for elevated lipids.  If she is unable to tolerated she may need to consider the new PCSK9 inhibitor injections.  She does have a follow-up visit at the lipid clinic with  Dr. Martinique next week to address this.  Check CT angiogram of the brain and neck to look at posterior circulation as the previous studies at outside hospital was suboptimal.  Also check screening hemoglobin A1c today.  Followup in the future with me in 4 months or call earlier if necessary.  Greater than 50% time during this 45-minute consultation visit was spent on counseling and coordination of care about a posterior circulation TIA and discussion about stroke prevention and treatment answering questions. Antony Contras, MD  Note: This document was prepared with digital dictation and possible smart phrase technology. Any transcriptional errors that result from this process are unintentional.

## 2021-03-11 NOTE — Patient Instructions (Signed)
I had a long d/w patient about her recent posterior dislocation TIA, risk for recurrent stroke/TIAs, personally independently reviewed imaging studies and stroke evaluation results and answered questions.Continue aspirin 81 mg daily  for secondary stroke prevention and maintain strict control of hypertension with blood pressure goal below 130/90, diabetes with hemoglobin A1c goal below 6.5% and lipids with LDL cholesterol goal below 70 mg/dL. I also advised the patient to eat a healthy diet with plenty of whole grains, cereals, fruits and vegetables, exercise regularly and maintain ideal body weight .I advised the patient to start taking co-Q10 200 mg daily and take full dose statin for elevated lipids.  If she is unable to tolerated she may need to consider the new PCSK9 inhibitor injections.  She does have a follow-up visit at the lipid clinic with Dr. Martinique next week to address this.  Check CT angiogram of the brain and neck to look at posterior circulation as the previous studies at outside hospital was suboptimal.  Also check screening hemoglobin A1c today.  Followup in the future with me in 4 months or call earlier if necessary. Stroke Prevention Some medical conditions and behaviors can lead to a higher chance of having a stroke. You can help prevent a stroke by eating healthy, exercising, not smoking, and managing any medical conditions you have. Stroke is a leading cause of functional impairment. Primary prevention is particularly important because a majority of strokes are first-time events. Stroke changes the lives of not only those who experience a stroke but also their family and other caregivers. How can this condition affect me? A stroke is a medical emergency and should be treated right away. A stroke can lead to brain damage and can sometimes be life-threatening. If a person gets medical treatment right away, there is a better chance of surviving and recovering from a stroke. What can  increase my risk? The following medical conditions may increase your risk of a stroke: Cardiovascular disease. High blood pressure (hypertension). Diabetes. High cholesterol. Sickle cell disease. Blood clotting disorders (hypercoagulable state). Obesity. Sleep disorders (obstructive sleep apnea). Other risk factors include: Being older than age 14. Having a history of blood clots, stroke, or mini-stroke (transient ischemic attack, TIA). Genetic factors, such as race, ethnicity, or a family history of stroke. Smoking cigarettes or using other tobacco products. Taking birth control pills, especially if you also use tobacco. Heavy use of alcohol or drugs, especially cocaine and methamphetamine. Physical inactivity. What actions can I take to prevent this? Manage your health conditions High cholesterol levels. Eating a healthy diet is important for preventing high cholesterol. If cholesterol cannot be managed through diet alone, you may need to take medicines. Take any prescribed medicines to control your cholesterol as told by your health care provider. Hypertension. To reduce your risk of stroke, try to keep your blood pressure below 130/80. Eating a healthy diet and exercising regularly are important for controlling blood pressure. If these steps are not enough to manage your blood pressure, you may need to take medicines. Take any prescribed medicines to control hypertension as told by your health care provider. Ask your health care provider if you should monitor your blood pressure at home. Have your blood pressure checked every year, even if your blood pressure is normal. Blood pressure increases with age and some medical conditions. Diabetes. Eating a healthy diet and exercising regularly are important parts of managing your blood sugar (glucose). If your blood sugar cannot be managed through diet and exercise, you may  need to take medicines. Take any prescribed medicines to control  your diabetes as told by your health care provider. Get evaluated for obstructive sleep apnea. Talk to your health care provider about getting a sleep evaluation if you snore a lot or have excessive sleepiness. Make sure that any other medical conditions you have, such as atrial fibrillation or atherosclerosis, are managed. Nutrition Follow instructions from your health care provider about what to eat or drink to help manage your health condition. These instructions may include: Reducing your daily calorie intake. Limiting how much salt (sodium) you use to 1,500 milligrams (mg) each day. Using only healthy fats for cooking, such as olive oil, canola oil, or sunflower oil. Eating healthy foods. You can do this by: Choosing foods that are high in fiber, such as whole grains, and fresh fruits and vegetables. Eating at least 5 servings of fruits and vegetables a day. Try to fill one-half of your plate with fruits and vegetables at each meal. Choosing lean protein foods, such as lean cuts of meat, poultry without skin, fish, tofu, beans, and nuts. Eating low-fat dairy products. Avoiding foods that are high in sodium. This can help lower blood pressure. Avoiding foods that have saturated fat, trans fat, and cholesterol. This can help prevent high cholesterol. Avoiding processed and prepared foods. Counting your daily carbohydrate intake.  Lifestyle If you drink alcohol: Limit how much you have to: 0-1 drink a day for women who are not pregnant. 0-2 drinks a day for men. Know how much alcohol is in your drink. In the [place] one drink equals one 12 oz bottle of beer (360mL), one 5 oz glass of wine (186mL), or one 1 oz glass of hard liquor (31mL). Do not use any products that contain nicotine or tobacco. These products include cigarettes, chewing tobacco, and vaping devices, such as e-cigarettes. If you need help quitting, ask your health care provider. Avoid secondhand smoke. Do not use  drugs. Activity  Try to stay at a healthy weight. Get at least 30 minutes of exercise on most days, such as: Fast walking. Biking. Swimming. Medicines Take over-the-counter and prescription medicines only as told by your health care provider. Aspirin or blood thinners (antiplatelets or anticoagulants) may be recommended to reduce your risk of forming blood clots that can lead to stroke. Avoid taking birth control pills. Talk to your health care provider about the risks of taking birth control pills if: You are over 24 years old. You smoke. You get very bad headaches. You have had a blood clot. Where to find more information American Stroke Association: www.strokeassociation.org Get help right away if: You or a loved one has any symptoms of a stroke. "BE FAST" is an easy way to remember the main warning signs of a stroke: B - Balance. Signs are dizziness, sudden trouble walking, or loss of balance. E - Eyes. Signs are trouble seeing or a sudden change in vision. F - Face. Signs are sudden weakness or numbness of the face, or the face or eyelid drooping on one side. A - Arms. Signs are weakness or numbness in an arm. This happens suddenly and usually on one side of the body. S - Speech. Signs are sudden trouble speaking, slurred speech, or trouble understanding what people say. T - Time. Time to call emergency services. Write down what time symptoms started. You or a loved one has other signs of a stroke, such as: A sudden, severe headache with no known cause. Nausea or vomiting. Seizure.  These symptoms may represent a serious problem that is an emergency. Do not wait to see if the symptoms will go away. Get medical help right away. Call your local emergency services (911 in the [place] Do not drive yourself to the hospital. Summary You can help to prevent a stroke by eating healthy, exercising, not smoking, limiting alcohol intake, and managing any medical conditions you may have. Do  not use any products that contain nicotine or tobacco. These include cigarettes, chewing tobacco, and vaping devices, such as e-cigarettes. If you need help quitting, ask your health care provider. Remember "BE FAST" for warning signs of a stroke. Get help right away if you or a loved one has any of these signs. This information is not intended to replace advice given to you by your health care provider. Make sure you discuss any questions you have with your health care provider. Document Revised: 09/04/2019 Document Reviewed: 09/04/2019 Elsevier Patient Education  [place].

## 2021-03-12 LAB — HEMOGLOBIN A1C
Est. average glucose Bld gHb Est-mCnc: 114 mg/dL
Hgb A1c MFr Bld: 5.6 % (ref 4.8–5.6)

## 2021-03-14 ENCOUNTER — Telehealth: Payer: Self-pay | Admitting: Cardiology

## 2021-03-14 ENCOUNTER — Encounter (HOSPITAL_COMMUNITY): Payer: Self-pay

## 2021-03-14 ENCOUNTER — Ambulatory Visit (HOSPITAL_COMMUNITY): Admission: RE | Admit: 2021-03-14 | Payer: No Typology Code available for payment source | Source: Ambulatory Visit

## 2021-03-14 NOTE — Telephone Encounter (Signed)
Spoke to patient she stated her insurance denied coronary ct.She requested a new order be submitted to include family history of heart disease.Order placed.

## 2021-03-14 NOTE — Telephone Encounter (Signed)
New Message: She says her CT Scan Referral needs to include Family History of Heart Disease , it not it will be denied again.

## 2021-03-14 NOTE — Telephone Encounter (Signed)
Received a message from pre cert insurance approved coronary ct.Patient was notified.

## 2021-03-14 NOTE — Addendum Note (Signed)
Addended by: Kathyrn Lass on: 03/14/2021 11:51 AM   Modules accepted: Orders

## 2021-03-17 ENCOUNTER — Other Ambulatory Visit: Payer: Self-pay

## 2021-03-17 ENCOUNTER — Ambulatory Visit (INDEPENDENT_AMBULATORY_CARE_PROVIDER_SITE_OTHER)
Payer: No Typology Code available for payment source | Admitting: Pharmacist Clinician (PhC)/ Clinical Pharmacy Specialist

## 2021-03-17 ENCOUNTER — Telehealth: Payer: Self-pay | Admitting: Pharmacist Clinician (PhC)/ Clinical Pharmacy Specialist

## 2021-03-17 DIAGNOSIS — E7800 Pure hypercholesterolemia, unspecified: Secondary | ICD-10-CM

## 2021-03-17 NOTE — Patient Instructions (Addendum)
Your Results:             Your most recent labs Goal  Total Cholesterol 238 < 200  Triglycerides 90 < 150  HDL (happy/good cholesterol) 68 > 40  LDL (lousy/bad cholesterol 154 < 70   Medication changes:  We will start the process to get Repatha covered by your insurance.  Once approved you will do one injection every 14 days.  We will make arrangements to have you repeat cholesterol labs after 4-6 doses.   Thank you for choosing [facility]

## 2021-03-17 NOTE — Assessment & Plan Note (Signed)
Met with the patient today and informed of her options for lowering her cholesterol: Zetia, Nexletol, Nexlizet, Repatha, and Leqvio. Explained to her the pros and cons of each medication and how much LDL lowering she should expect to see with each medication. After our discussion, she agreed to starting Repatha injections once every two weeks. Counseled the patients on proper administration of the injection and possible side effects including cold/flu-like symptoms for 24-48 hours and that these side effects are transient as she receives more doses. We will send in the paperwork to get her insurance to cover her Repatha. We would like for her to get lab work after 4-6 doses to check her LDL levels again.

## 2021-03-17 NOTE — Telephone Encounter (Signed)
Patient in office today, please start PA process for Repatha 140 mg, with labs after 4-6 doses.  Co-pay card given in office

## 2021-03-17 NOTE — Progress Notes (Signed)
03/17/2021 Lori Wilcox 08-Sep-1960 [id number]   HPI: Lori Wilcox is a 61 y/o female with a history of TIA, HLD, and HTN presenting today for lipid follow-up after a referral by Dr. Martinique. She had a recent TIA in November 2022 where she experienced sudden onset of lightheadedness, blurred vision, feeling off balance, double vision, nausea, vomiting, some numbness, and speech difficulties. She was taken to [facility] in [place] where she had a CT and MRI both of which were negative except for small vessel disease. During her admission, her lipid profile was elevated and she was started on Crestor. She had trouble tolerating it and was switched to Lipitor which also caused muscle pain for her and eventually stopped the Lipitor as well. She was seen by neurology on 03/11/2021 who noted her last lipid panel on 03/06/2021 showed TC 238, HDL 68, and LDL 154. Neurology recommended strict BP control below 130/90, A1c < 6.5, and LDL < 70. Prior to this visit, she saw Dr. Martinique on 03/04/2021 where he referred her to the lipid clinic today. She is statin intolerant and is unlikely to achieve her goal LDL < 70 with Zetia alone. Dr. Martinique placed a referral for the patient to receive a coronary CT, which was approved 03/14/2021.  Says she is feeling good and is doing quite well overall. She is very motivated to get her cholesterol under control and to goal. 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 Says that in endurance racing they sometimes will race the horses 100 miles in one day.   PMH: HLD - no current medications, LDL 154 03/06/2021 HTN - no current medications  TIA - aspirin 81 mg   LDL Goal < 70  Previous Antihyperlipidemics  Crestor - severe headaches, body aches, constipation Lipitor - severe headaches, body aches, constipation   Family Hx: Neither mom or dad have heart disease; mom had lung and breast cancer; Grandfather died of heart disease; sister has high cholesterol; No  children, just her pets   Social Hx: Former smoker (25 years ago); glass of wine every other day but trying to cut back; no coffee, drinks green tea, no sodas   Diet: Salmon baked, greens, tries to stay away from things that are processed, greek yogurt, really likes sweets and cookies; no red meat or pork; cooks at home mostly   Exercise: Exercises at the gym 2-3 nights a week; 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     Coenzyme Q10 (CO Q10) 100 MG CAPS Take 1 tablet by mouth daily. Take one capsule by mouth daily     metoprolol tartrate (LOPRESSOR) 100 MG tablet Take 100 mg 2 hours before coronary ct 1 tablet 0   VITAMIN D, CHOLECALCIFEROL, PO Take 2,000 Int'l Units/day by mouth daily.     Vitamin Mixture (VITAMIN E COMPLETE) CAPS Take 1 capsule by mouth daily.     zolpidem (AMBIEN) 10 MG tablet Take 1 tablet (10 mg total) by mouth  at bedtime as needed. 30 tablet 1   No current facility-administered medications for this visit.    Allergies  Allergen Reactions   Atorvastatin Other (See Comments)    Severe headaches, body aches, constipation   Rosuvastatin Other (See Comments)    Severe headaches, body aches, constipation    Past Medical History:  Diagnosis Date   Acid reflux    Broken finger 2005   Carotid artery disease (Uncertain)    Fatigue 09/17/2010   VITAMIN D DEFICIENCY ?DEPRESSION   Hepatic steatosis    Hypercholesteremia    Insomnia    Liver lesion, right lobe    Osteopenia 10/2018   T score -1.4  FRAX 7% / 0.5%   Seasonal allergic rhinitis due to pollen    TIA (transient ischemic attack)     Blood pressure 112/60, pulse 60, resp. rate 14, height 5' 5" (1.651 m), weight 161 lb (73 kg), last menstrual period 02/01/2012, SpO2 99 %.   Hypercholesterolemia Met with the patient today and informed of her options for lowering her cholesterol: Zetia, Nexletol, Nexlizet, Repatha, and Leqvio. Explained to her the pros and cons of each medication and how much LDL lowering she should expect to see with each medication. After our discussion, she agreed to starting Repatha injections once every two weeks. Counseled the patients on proper administration of the injection and possible side effects including cold/flu-like symptoms for 24-48 hours and that these side effects are transient as she receives more doses. We will send in the paperwork to get her insurance to cover her Repatha. We would like for her to get lab work after 4-6 doses to check her LDL levels again.   Glee Arvin PharmD Candidate [facility] [employer] [facility] Class of [place] PharmD CPP [facility] [address] [place], [place] [address] [phone]

## 2021-03-18 NOTE — Telephone Encounter (Signed)
Lipid/hepatic panel ordered and released  Lori Wilcox (Key: [id number]) Repatha SureClick 140MG /ML auto-injectors Status: Sent To Plan Created: January 31st, 2023 Sent: January 31st, 2023

## 2021-03-20 ENCOUNTER — Encounter: Payer: Self-pay | Admitting: Nurse Practitioner

## 2021-03-20 ENCOUNTER — Ambulatory Visit (INDEPENDENT_AMBULATORY_CARE_PROVIDER_SITE_OTHER): Payer: No Typology Code available for payment source | Admitting: Nurse Practitioner

## 2021-03-20 ENCOUNTER — Ambulatory Visit: Payer: No Typology Code available for payment source | Admitting: Nurse Practitioner

## 2021-03-20 ENCOUNTER — Other Ambulatory Visit (HOSPITAL_COMMUNITY)
Admission: RE | Admit: 2021-03-20 | Discharge: 2021-03-20 | Disposition: A | Discharge status: Home / Self Care | Payer: No Typology Code available for payment source | Source: Ambulatory Visit | Attending: Nurse Practitioner | Admitting: Nurse Practitioner

## 2021-03-20 ENCOUNTER — Other Ambulatory Visit: Payer: Self-pay

## 2021-03-20 VITALS — BP 122/76 | Ht 63.5 in | Wt 160.0 lb

## 2021-03-20 DIAGNOSIS — M8589 Other specified disorders of bone density and structure, multiple sites: Secondary | ICD-10-CM | POA: Diagnosis not present

## 2021-03-20 DIAGNOSIS — Z01419 Encounter for gynecological examination (general) (routine) without abnormal findings: Secondary | ICD-10-CM | POA: Insufficient documentation

## 2021-03-20 DIAGNOSIS — Z780 Asymptomatic menopausal state: Secondary | ICD-10-CM

## 2021-03-20 NOTE — Progress Notes (Signed)
° °  Lori Wilcox Oct 08, 1960 [id number]   History:  61 y.o. G0 presents for annual exam. Postmenopausal - no HRT, no bleeding. Normal pap and mammogram history. Had TIA in November 2022 while camping. Neurology and cardiology managing, currently on Aspirin. Has been receiving injections for back pain.   Gynecologic History Patient's last menstrual period was 02/01/2012.   Contraception/Family planning: post menopausal status Sexually active: No  Health Maintenance Last Pap: 02/27/2016. Results were: Normal Last mammogram: 06/26/2020. Results were: Normal Last colonoscopy: 2022. Results were: Benign polyps, 10-year recall Last Dexa: 11/12/2020. Results were: T-score -1.6, FRAX 8.4% / 0.8%  Past medical history, past surgical history, family history and social history were all reviewed and documented in the EPIC chart. Single. 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 Mother diagnosed with breast cancer at 60.  ROS:  A ROS was performed and pertinent positives and negatives are included.  Exam:  Vitals:   03/20/21 1425  BP: 122/76  Weight: 160 lb (72.6 kg)  Height: 5' 3.5" (1.613 m)    Body mass index is 27.9 kg/m.  General appearance:  Normal Thyroid:  Symmetrical, normal in size, without palpable masses or nodularity. Respiratory  Auscultation:  Clear without wheezing or rhonchi Cardiovascular  Auscultation:  Regular rate, without rubs, murmurs or gallops  Edema/varicosities:  Not grossly evident Abdominal  Soft,nontender, without masses, guarding or rebound.  Liver/spleen:  No organomegaly noted  Hernia:  None appreciated  Skin  Inspection:  Grossly normal   Breasts: Examined lying and sitting.   Right: Without masses, retractions, discharge or axillary adenopathy.   Left: Without masses, retractions, discharge or axillary adenopathy. Genitourinary   Inguinal/mons:  Normal without inguinal adenopathy  External genitalia:   Normal appearing vulva with no masses, tenderness, or lesions  BUS/Urethra/Skene's glands:  Normal  Vagina:  Normal appearing with normal color and discharge, no lesions  Cervix:  Normal appearing without discharge or lesions  Uterus:  Normal in size, shape and contour.  Midline and mobile, nontender  Adnexa/parametria:     Rt: Normal in size, without masses or tenderness.   Lt: Normal in size, without masses or tenderness.  Anus and perineum: Normal  Digital rectal exam: Normal sphincter tone without palpated masses or tenderness  Patient informed chaperone available to be present for breast and pelvic exam. Patient has requested no chaperone to be present. Patient has been advised what will be completed during breast and pelvic exam.   Assessment/Plan:  61 y.o. G0 for annual exam.   Well female exam with routine gynecological exam - Plan: Cytology - PAP( [facility]). Education provided on SBEs, importance of preventative screenings, current guidelines, high calcium diet, regular exercise, and multivitamin daily.  Labs done elsewhere.   Postmenopausal - no HRT, no bleeding  Osteopenia of multiple sites - 10/2020 T-score -1.6 without elevated FRAX. Continue Vitamin D supplement, she consumed high calcium diet. Also recommend regular exercise with resistance training. She is limited d/t chronic back pain. Will repeat in 2024.   Screening for cervical cancer - Normal Pap history.  Pap with HR HPV today.   Screening for breast cancer - Normal mammogram history.  Continue annual screenings.  Normal breast exam today.   Screening for colon cancer - 2022 colonoscopy. Will repeat at 10-year interval per GI's recommendation.   Follow up in 1 year for annual.      Tamela Gammon [facility], 3:08 PM 03/20/2021

## 2021-03-20 NOTE — Progress Notes (Deleted)
° °  TERIN DIEROLF Jan 16, 1961 [id number]   History:  61 y.o. G0 presents for annual exam. Postmenopausal - no HRT, no bleeding. Normal pap and mammogram history. Osteopenia. History of Vitamin D deficiency. TIA, HLD managed by cardiology.   Gynecologic History Patient's last menstrual period was 02/01/2012.   Contraception/Family planning: post menopausal status Sexually active: ***  Health Maintenance Last Pap: 02/27/2016. Results were: Normal Last mammogram: 06/26/2020. Results were: Normal Last colonoscopy: 2017. Results were: Polyp, 5-year recall Last Dexa: 11/12/2020. Results were: T-score -1.6, FRAX 8.4% / 0.8%  Past medical history, past surgical history, family history and social history were all reviewed and documented in the EPIC chart. Married. 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 Mother diagnosed with breast cancer at 30.  ROS:  A ROS was performed and pertinent positives and negatives are included.  Exam:  There were no vitals filed for this visit.  There is no height or weight on file to calculate BMI.  General appearance:  Normal Thyroid:  Symmetrical, normal in size, without palpable masses or nodularity. Respiratory  Auscultation:  Clear without wheezing or rhonchi Cardiovascular  Auscultation:  Regular rate, without rubs, murmurs or gallops  Edema/varicosities:  Not grossly evident Abdominal  Soft,nontender, without masses, guarding or rebound.  Liver/spleen:  No organomegaly noted  Hernia:  None appreciated  Skin  Inspection:  Grossly normal   Breasts: Examined lying and sitting.   Right: Without masses, retractions, discharge or axillary adenopathy.   Left: Without masses, retractions, discharge or axillary adenopathy. Genitourinary   Inguinal/mons:  Normal without inguinal adenopathy  External genitalia:  Normal appearing vulva with no masses, tenderness, or lesions  BUS/Urethra/Skene's glands:  Normal  Vagina:  Normal appearing with normal color  and discharge, no lesions  Cervix:  Normal appearing without discharge or lesions  Uterus:  Normal in size, shape and contour.  Midline and mobile, nontender  Adnexa/parametria:     Rt: Normal in size, without masses or tenderness.   Lt: Normal in size, without masses or tenderness.  Anus and perineum: Normal  Digital rectal exam: Normal sphincter tone without palpated masses or tenderness  Patient informed chaperone available to be present for breast and pelvic exam. Patient has requested no chaperone to be present. Patient has been advised what will be completed during breast and pelvic exam.   Assessment/Plan:  61 y.o. G0 for annual exam.   Well female exam with routine gynecological exam - Education provided on SBEs, importance of preventative screenings, current guidelines, high calcium diet, regular exercise, and multivitamin daily. Labs with PCP.   Postmenopausal - no HRT, no bleeding  Osteopenia of multiple sites - 10/2020 T-score -1.6 without elevated FRAX. Recommend taking Vitamin D supplement consistently and eating a high calcium diet. Also recommend regular exercise with resistance training. Will repeat in 2024.   Screening for cervical cancer - Normal Pap history.  Pap with HR HPV today.   Screening for breast cancer - Normal mammogram history.  Continue annual screenings.  Normal breast exam today.   Screening for colon cancer - 2017 colonoscopy. Will repeat this year at GI's recommended interval.   Follow up in 1 year for annual.     Tamela Gammon [facility], 9:20 AM 03/20/2021

## 2021-03-21 NOTE — Telephone Encounter (Signed)
Will forward to [facility] who started PA   Left message to call back to let her know

## 2021-03-21 NOTE — Telephone Encounter (Signed)
° °  Pt is following up repatha

## 2021-03-24 ENCOUNTER — Ambulatory Visit: Payer: No Typology Code available for payment source | Admitting: Neurology

## 2021-03-24 ENCOUNTER — Telehealth (HOSPITAL_COMMUNITY): Payer: Self-pay | Admitting: Emergency Medicine

## 2021-03-24 LAB — CYTOLOGY - PAP
Comment: NEGATIVE
Diagnosis: NEGATIVE
High risk HPV: NEGATIVE

## 2021-03-24 NOTE — Telephone Encounter (Signed)
Called and spoke w/pt and let them know I submitted the request and that we submitted the request and we are awaiting determination. The pt voiced understanding

## 2021-03-24 NOTE — Telephone Encounter (Signed)
Reaching out to patient to offer assistance regarding upcoming cardiac imaging study; pt verbalizes understanding of appt date/time, parking situation and where to check in, pre-test NPO status and medications ordered, and verified current allergies; name and call back number provided for further questions should they arise Marchia Bond RN Navigator Cardiac Imaging [facility] [phone] office [phone] cell  Denies iv issues 100mg  metoprolol tartate  Arrival 330

## 2021-03-25 ENCOUNTER — Encounter (HOSPITAL_COMMUNITY): Payer: Self-pay

## 2021-03-25 ENCOUNTER — Ambulatory Visit (HOSPITAL_COMMUNITY)
Admission: RE | Admit: 2021-03-25 | Discharge: 2021-03-25 | Disposition: A | Discharge status: Home / Self Care | Payer: No Typology Code available for payment source | Source: Ambulatory Visit | Attending: Cardiology | Admitting: Cardiology

## 2021-03-25 ENCOUNTER — Other Ambulatory Visit: Payer: Self-pay

## 2021-03-25 DIAGNOSIS — R5383 Other fatigue: Secondary | ICD-10-CM | POA: Diagnosis present

## 2021-03-25 DIAGNOSIS — R0609 Other forms of dyspnea: Secondary | ICD-10-CM | POA: Insufficient documentation

## 2021-03-25 DIAGNOSIS — I6521 Occlusion and stenosis of right carotid artery: Secondary | ICD-10-CM | POA: Insufficient documentation

## 2021-03-25 DIAGNOSIS — G459 Transient cerebral ischemic attack, unspecified: Secondary | ICD-10-CM | POA: Insufficient documentation

## 2021-03-25 DIAGNOSIS — E7800 Pure hypercholesterolemia, unspecified: Secondary | ICD-10-CM | POA: Insufficient documentation

## 2021-03-25 DIAGNOSIS — Z8249 Family history of ischemic heart disease and other diseases of the circulatory system: Secondary | ICD-10-CM | POA: Insufficient documentation

## 2021-03-25 MED ORDER — NITROGLYCERIN 0.4 MG SL SUBL
SUBLINGUAL_TABLET | SUBLINGUAL | Status: AC
  Administered 2021-03-25: 0.8 mg via SUBLINGUAL
  Filled 2021-03-25: qty 2

## 2021-03-25 MED ORDER — IOHEXOL 350 MG/ML SOLN
95.0000 mL | Freq: Once | INTRAVENOUS | Status: AC | PRN
  Administered 2021-03-25: 95 mL via INTRAVENOUS

## 2021-03-25 MED ORDER — NITROGLYCERIN 0.4 MG SL SUBL: 0.8000 mg | SUBLINGUAL_TABLET | Freq: Once | SUBLINGUAL | Status: AC

## 2021-04-03 ENCOUNTER — Other Ambulatory Visit: Payer: Self-pay

## 2021-04-03 ENCOUNTER — Telehealth: Payer: Self-pay | Admitting: Cardiology

## 2021-04-03 ENCOUNTER — Ambulatory Visit
Admission: RE | Admit: 2021-04-03 | Discharge: 2021-04-03 | Disposition: A | Discharge status: Home / Self Care | Payer: No Typology Code available for payment source | Source: Ambulatory Visit | Attending: Neurology | Admitting: Neurology

## 2021-04-03 ENCOUNTER — Other Ambulatory Visit: Payer: No Typology Code available for payment source

## 2021-04-03 DIAGNOSIS — I6521 Occlusion and stenosis of right carotid artery: Secondary | ICD-10-CM

## 2021-04-03 DIAGNOSIS — E7800 Pure hypercholesterolemia, unspecified: Secondary | ICD-10-CM

## 2021-04-03 MED ORDER — IOPAMIDOL (ISOVUE-370) INJECTION 76%
75.0000 mL | Freq: Once | INTRAVENOUS | Status: AC | PRN
  Administered 2021-04-03: 75 mL via INTRAVENOUS

## 2021-04-03 MED ORDER — EZETIMIBE 10 MG PO TABS: 10.0000 mg | ORAL_TABLET | Freq: Every day | ORAL | 0 refills | Status: DC

## 2021-04-03 NOTE — Telephone Encounter (Signed)
Zetia required to try first before Repatha.  Sent in Rx

## 2021-04-03 NOTE — Telephone Encounter (Signed)
° °  Pt c/o medication issue:  1. Name of Medication: zetia  2. How are you currently taking this medication (dosage and times per day)?   3. Are you having a reaction (difficulty breathing--STAT)?   4. What is your medication issue? Pt is requesting to speak with lipid clinic regarding this meds

## 2021-04-07 NOTE — Progress Notes (Signed)
Kindly inform the patient that CT angiogram study of blood vessels of brain and neck shows only minor narrowing but no major blockages to worry about

## 2021-04-08 ENCOUNTER — Encounter: Payer: Self-pay | Admitting: *Deleted

## 2021-05-02 ENCOUNTER — Telehealth: Payer: Self-pay | Admitting: Obstetrics and Gynecology

## 2021-05-02 ENCOUNTER — Other Ambulatory Visit: Payer: Self-pay

## 2021-05-02 ENCOUNTER — Ambulatory Visit (INDEPENDENT_AMBULATORY_CARE_PROVIDER_SITE_OTHER): Payer: No Typology Code available for payment source | Admitting: Obstetrics and Gynecology

## 2021-05-02 ENCOUNTER — Encounter: Payer: Self-pay | Admitting: Obstetrics and Gynecology

## 2021-05-02 VITALS — BP 120/78

## 2021-05-02 DIAGNOSIS — R102 Pelvic and perineal pain: Secondary | ICD-10-CM

## 2021-05-02 DIAGNOSIS — N6315 Unspecified lump in the right breast, overlapping quadrants: Secondary | ICD-10-CM

## 2021-05-02 LAB — URINALYSIS, COMPLETE W/RFL CULTURE
Bacteria, UA: NONE SEEN /HPF
Bilirubin Urine: NEGATIVE
Glucose, UA: NEGATIVE
Hgb urine dipstick: NEGATIVE
Hyaline Cast: NONE SEEN /LPF
Ketones, ur: NEGATIVE
Leukocyte Esterase: NEGATIVE
Nitrites, Initial: NEGATIVE
Protein, ur: NEGATIVE
RBC / HPF: NONE SEEN /HPF (ref 0–2)
Specific Gravity, Urine: 1.01 (ref 1.001–1.035)
WBC, UA: NONE SEEN /HPF (ref 0–5)
pH: 7 (ref 5.0–8.0)

## 2021-05-02 LAB — NO CULTURE INDICATED

## 2021-05-02 NOTE — Telephone Encounter (Signed)
FYI. ?Scheduled for 05/22/21 @ 11:00. ? ?Pt notified and voiced understanding. Will call at her convenience to check for cancellations for a sooner appt.  ?

## 2021-05-02 NOTE — Telephone Encounter (Signed)
Please schedule dx right mammogram and right breast US at the [facility].  ?Patient feels a small mass 4 inches above the nipple at 12 o'clock.  I do not feel the mass clearly.  ? ?She has family history of breast cancer.  ?

## 2021-05-02 NOTE — Progress Notes (Signed)
GYNECOLOGY  VISIT ?  ?HPI: ?61 y.o.   Married  Caucasian  female   ?[id number] with Patient's last menstrual period was 02/01/2012.   ?here for  right breast lump,pelvic pain x 3 weeks  ? ?A couple of nights ago noted a pea size knot above her right nipple.  ?Soreness noted.  ?No prior breast lumps.  ?No hormone therapy.  ?Mother had breast cancer.  ? ?Has lower groin achiness, worse with exercise and walking x 3 weeks.  ?Pain started one week after her horse had a spook and patient was riding her at the time. ?Her abdomen is tender to the touch.  ?Has not taken any pain medication for this.  ?She usually takes a baby ASA daily due to a TIA last fall.  ?Never had this pain before.  ?She does have SI joint pain.  ?States she has hx of uterine cysts.  ?No bleeding.  ?No unusual vaginal discharge.  ?No painful urination.  ?Not sexually active for years.  ?No change in bowel movements.  ? ?She states the pelvic pain was nothing she would see a provider for, except that that she was already coming in today for breat evaluation.  ? ?GYNECOLOGIC HISTORY: ?Patient's last menstrual period was 02/01/2012. ?Contraception:  PMP ?Menopausal hormone therapy:  none ?Last mammogram:  06-26-20 normal ?Last pap smear:   03-20-21 ?       ?OB History   ? ? Gravida  ?0  ? Para  ?0  ? Term  ?0  ? Preterm  ?0  ? AB  ?0  ? Living  ?0  ?  ? ? SAB  ?0  ? IAB  ?0  ? Ectopic  ?0  ? Multiple  ?0  ? Live Births  ?0  ?   ?  ?  ?    ? ?Patient Active Problem List  ? Diagnosis Date Noted  ? Allergic rhinitis due to pollen 02/21/2021  ? Transient ischemic attack 02/21/2021  ? 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 Chronic pain 02/21/2021  ? Disorder of right patellofemoral joint 02/21/2021  ? Hypercholesterolemia 02/21/2021  ? Osteopenia 02/21/2021  ? Steatosis of liver 02/21/2021  ? Personal history of colonic polyps 02/22/2013  ? Insomnia 02/22/2013  ? ? ?Past Medical History:  ?Diagnosis Date  ? Acid reflux   ? Broken finger 2005  ? Carotid  artery disease ([place])   ? Fatigue 09/17/2010  ? VITAMIN D DEFICIENCY ?DEPRESSION  ? Hepatic steatosis   ? Hypercholesteremia   ? Insomnia   ? Liver lesion, right lobe   ? Osteopenia 10/2018  ? T score -1.4 FRAX 7% / 0.5%  ? Seasonal allergic rhinitis due to pollen   ? TIA (transient ischemic attack)   ? ? ?Past Surgical History:  ?Procedure Laterality Date  ? GANGLION CYST EXCISION    ? LAPAROSCOPIC APPENDECTOMY  2000  ? SHOULDER SURGERY  2006  ? TMJ ARTHROPLASTY  1986  ? ? ?Current Outpatient Medications  ?Medication Sig Dispense Refill  ? ASPIRIN LOW DOSE PO Take 1 tablet by mouth daily.    ? Coenzyme Q10 (CO Q10) 100 MG CAPS Take 1 tablet by mouth daily. Take one capsule by mouth daily    ? ezetimibe (ZETIA) 10 MG tablet Take 1 tablet (10 mg total) by mouth daily. 90 tablet 0  ? Multiple Vitamin (MULTIVITAMIN) capsule Take 1 capsule by mouth daily.    ? VITAMIN D, CHOLECALCIFEROL, PO Take 2,000 Int'l Units/day by mouth daily.    ?  Vitamin Mixture (VITAMIN E COMPLETE) CAPS Take 1 capsule by mouth daily.    ? zolpidem (AMBIEN) 10 MG tablet Take 1 tablet (10 mg total) by mouth at bedtime as needed. 30 tablet 1  ? metoprolol tartrate (LOPRESSOR) 100 MG tablet Take 100 mg 2 hours before coronary ct (Patient not taking: Reported on 03/20/2021) 1 tablet 0  ? ?No current facility-administered medications for this visit.  ?  ? ?ALLERGIES: Atorvastatin and Rosuvastatin ? ?Family History  ?Problem Relation Age of Onset  ? Lung cancer Mother   ? Breast cancer Mother 52  ? COPD Mother   ? Liver disease Brother   ? Alpha-1 antitrypsin deficiency Brother   ? Uterine cancer Maternal Grandmother   ? Colon cancer Maternal Grandfather   ? Colon cancer Paternal Grandfather   ? Heart failure Paternal Grandfather   ? ? ?Social History  ? ?Socioeconomic History  ? Marital status: Married  ?  Spouse name: Not on file  ? Number of children: Not on file  ? Years of education: Not on file  ? Highest education level: Not on file  ?Occupational  History  ? Not on file  ?Tobacco Use  ? Smoking status: Former  ? Smokeless tobacco: Never  ?Vaping Use  ? Vaping Use: Never used  ?Substance and Sexual Activity  ? Alcohol use: Yes  ?  Alcohol/week: 0.0 standard drinks  ?  Comment: OCCASIONAL SOCIAL  ? Drug use: No  ? Sexual activity: Not Currently  ?  Comment: DENIED INSURANCE QUESTIONS  ?Other Topics Concern  ? 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 ?Social Determinants of Health  ? ?Financial Resource Strain: Not on file  ?Food Insecurity: Not on file  ?Transportation Needs: Not on file  ?Physical Activity: Not on file  ?Stress: Not on file  ?Social Connections: Not on file  ?Intimate Partner Violence: Not on file  ? ? ?Review of Systems  See HPI.  ? ?PHYSICAL EXAMINATION:   ? ?BP 120/78 (BP Location: Left Arm, Patient Position: Sitting, Cuff Size: Normal)   LMP 02/01/2012     ?General appearance: alert, cooperative and appears stated age ? ?Breasts: normal appearance, no masses or tenderness, No nipple retraction or dimpling, No nipple discharge or bleeding, No axillary or supraclavicular adenopathy ?  ?Abdomen: soft, mildly tender in lower abdomen, no masses,  no organomegaly ?  ?Pelvic: External genitalia:  no lesions ?             Urethra:  normal appearing urethra with no masses, tenderness or lesions ?             Bartholins and Skenes: normal    ?             Vagina: normal appearing vagina with normal color and discharge, no lesions ?             Cervix: no lesions ?               ?Bimanual Exam:  Uterus:  normal size, contour, position, consistency, mobility, non-tender ?             Adnexa: no mass, fullness, tenderness ?      ? ?Chaperone was present for exam:  Onalee Hua. CMA ? ?ASSESSMENT ? ?Pelvic pain.  Likely muscle strain.  ?Right breast lump.  Difficult for me to confirm on exam today. ?FH breast cancer in mother. ?Hx TIA.  ? ?PLAN ? ?Urinalysis negative.  ?No  UC sent.  ?Rest, Tylenol and heat.  ?Dx right mammogram and right breast  US. ?Return if pelvic pain persists. ?  ?An After Visit Summary was printed and given to the patient. ? ?23 min  total time was spent for this patient encounter, including preparation, face-to-face counseling with the patient, coordination of care, and documentation of the encounter. ? ?  ?

## 2021-05-22 ENCOUNTER — Other Ambulatory Visit: Payer: No Typology Code available for payment source

## 2021-06-18 LAB — HEPATIC FUNCTION PANEL
ALT: 15 IU/L (ref 0–32)
AST: 23 IU/L (ref 0–40)
Albumin: 4.9 g/dL (ref 3.8–4.9)
Alkaline Phosphatase: 62 IU/L (ref 44–121)
Bilirubin Total: 0.3 mg/dL (ref 0.0–1.2)
Bilirubin, Direct: <0.1 mg/dL (ref 0.00–0.40)
Total Protein: 7.1 g/dL (ref 6.0–8.5)

## 2021-06-18 LAB — LIPID PANEL
Chol/HDL Ratio: 3.3 ratio (ref 0.0–4.4)
Cholesterol, Total: 219 mg/dL — ABNORMAL HIGH (ref 100–199)
HDL: 67 mg/dL (ref >39)
LDL Chol Calc (NIH): 132 mg/dL — ABNORMAL HIGH (ref 0–99)
Triglycerides: 112 mg/dL (ref 0–149)
VLDL Cholesterol Cal: 20 mg/dL (ref 5–40)

## 2021-06-19 ENCOUNTER — Telehealth: Payer: Self-pay | Admitting: Cardiology

## 2021-06-19 DIAGNOSIS — E7800 Pure hypercholesterolemia, unspecified: Secondary | ICD-10-CM

## 2021-06-19 NOTE — Telephone Encounter (Signed)
Pt c/o medication issue: ? ?1. Name of Medication: Repatha ? ?2. How are you currently taking this medication (dosage and times per day)?   ? ?3. Are you having a reaction (difficulty breathing--STAT)? no ? ?4. What is your medication issue? Patient is calling to get approve for the medication. States she had a new cholesterol test done on yesterday. Please advise ? ?

## 2021-06-20 NOTE — Telephone Encounter (Signed)
Insurance required trial with ezetimibe before PCS[facility]-9 approval.  Has been on ezetimibe for 3 months, LDL dropped from 154 to 132.  Still above goal of <70.   ? ?LMOM that we will now start process to get [place] covered.  Repeat labs in 3 months.   ?

## 2021-06-23 NOTE — Telephone Encounter (Signed)
Paper pa form completed in all aspects except providers signature.  ?

## 2021-06-23 NOTE — Telephone Encounter (Signed)
Lipid panel and hepatic panel ordered and released.  ?

## 2021-06-24 ENCOUNTER — Other Ambulatory Visit: Payer: Self-pay | Admitting: Orthopaedic Surgery

## 2021-06-24 DIAGNOSIS — M545 Low back pain, unspecified: Secondary | ICD-10-CM

## 2021-06-24 NOTE — Telephone Encounter (Signed)
Pa forms handed to cheryl pugh to have dr. [place] sign asap  ?

## 2021-06-25 NOTE — Telephone Encounter (Signed)
Faxed information to [facility] ?

## 2021-06-27 ENCOUNTER — Ambulatory Visit
Admission: RE | Admit: 2021-06-27 | Discharge: 2021-06-27 | Disposition: A | Discharge status: Home / Self Care | Payer: No Typology Code available for payment source | Source: Ambulatory Visit | Attending: Obstetrics and Gynecology | Admitting: Obstetrics and Gynecology

## 2021-06-27 DIAGNOSIS — N6315 Unspecified lump in the right breast, overlapping quadrants: Secondary | ICD-10-CM

## 2021-06-29 ENCOUNTER — Other Ambulatory Visit: Payer: Self-pay | Admitting: Cardiology

## 2021-06-29 DIAGNOSIS — E7800 Pure hypercholesterolemia, unspecified: Secondary | ICD-10-CM

## 2021-06-29 DIAGNOSIS — I6521 Occlusion and stenosis of right carotid artery: Secondary | ICD-10-CM

## 2021-07-01 ENCOUNTER — Other Ambulatory Visit: Payer: Self-pay | Admitting: Orthopedic Surgery

## 2021-07-01 ENCOUNTER — Telehealth: Payer: Self-pay | Admitting: Neurology

## 2021-07-01 DIAGNOSIS — G8929 Other chronic pain: Secondary | ICD-10-CM

## 2021-07-01 NOTE — Telephone Encounter (Signed)
LVM and sent mychart msg informing pt of r/s needed for 6/26 appt- Dr. Leonie Man out. ?

## 2021-07-02 NOTE — Telephone Encounter (Signed)
Patient calling to check the status of the Prior Auth. Please call back  ?

## 2021-07-02 NOTE — Telephone Encounter (Signed)
Called and spoke with a representative at [facility] and they stated that it was currently under review.  ?

## 2021-07-02 NOTE — Telephone Encounter (Signed)
Called and spoke to pt and explained that the pa was still under review and that I would notify them once approved.  ?

## 2021-07-09 NOTE — Telephone Encounter (Signed)
Called and spoke w/pt and mentioned that repatha is excluded from the members drug plan so I will mail her pt assistance forms they voiced understanding.

## 2021-07-09 NOTE — Telephone Encounter (Signed)
Called and spoke w/[facility] and they stated that the repatha is exluded from the members plan. I will need to call the pt and offer a pt assistance form to see if they can receive the drug free from the manufacturer.

## 2021-07-10 MED ORDER — REPATHA SURECLICK 140 MG/ML ~~LOC~~ SOAJ: 140.0000 mg | SUBCUTANEOUS | 11 refills | Status: DC

## 2021-07-10 NOTE — Addendum Note (Signed)
Addended by: Allean Found on: 07/10/2021 12:07 PM   Modules accepted: Orders

## 2021-07-10 NOTE — Telephone Encounter (Signed)
Called pt and stated that they were approved for repatha after being told yesterday that it was exluded from the benefit plan and sending the pt amgen snf forms. I got the pt a copay card approved:RxBin: [id number] [place]: CN RxGrp: [id number] ID: [id number]. Advised the pt to complete fasting labs post 4th dose and to please call and let me know if they had any issues receiving the medication. Pt voiced understanding.

## 2021-07-30 ENCOUNTER — Telehealth: Payer: Self-pay | Admitting: Cardiology

## 2021-07-30 NOTE — Telephone Encounter (Signed)
Dr. Laurann Montana calling to speak with Dr. [place] regarding pt. Call transferred

## 2021-07-31 ENCOUNTER — Other Ambulatory Visit (HOSPITAL_COMMUNITY): Payer: Self-pay | Admitting: Internal Medicine

## 2021-07-31 DIAGNOSIS — Z8673 Personal history of transient ischemic attack (TIA), and cerebral infarction without residual deficits: Secondary | ICD-10-CM

## 2021-08-01 NOTE — Telephone Encounter (Signed)
Dr Martinique spoke to Dr Laurann Montana

## 2021-08-08 ENCOUNTER — Telehealth: Payer: Self-pay | Admitting: Cardiology

## 2021-08-08 MED ORDER — REPATHA SURECLICK 140 MG/ML ~~LOC~~ SOAJ: 140.0000 mg | SUBCUTANEOUS | 11 refills | Status: DC

## 2021-08-08 NOTE — Telephone Encounter (Signed)
Repatha rx sent.

## 2021-08-08 NOTE — Telephone Encounter (Signed)
Pt c/o medication issue:  1. Name of Medication: Evolocumab (REPATHA SURECLICK) 140 MG/ML SOAJ  2. How are you currently taking this medication (dosage and times per day)? Inject every 14 days  3. Are you having a reaction (difficulty breathing--STAT)? no  4. What is your medication issue? Patient states her pharmacy has changed for the repatha. She states the new pharmacy is [facility] [address] [place] [address]. She says repatha is the only medication she wants sent there. Phone: [phone]  Fax [phone]

## 2021-08-11 ENCOUNTER — Ambulatory Visit: Payer: No Typology Code available for payment source | Admitting: Neurology

## 2021-08-11 ENCOUNTER — Ambulatory Visit (HOSPITAL_COMMUNITY)
Admission: RE | Admit: 2021-08-11 | Discharge: 2021-08-11 | Disposition: A | Discharge status: Home / Self Care | Payer: No Typology Code available for payment source | Source: Ambulatory Visit | Attending: Internal Medicine | Admitting: Internal Medicine

## 2021-08-11 DIAGNOSIS — Z8673 Personal history of transient ischemic attack (TIA), and cerebral infarction without residual deficits: Secondary | ICD-10-CM | POA: Diagnosis present

## 2021-08-11 LAB — ECHOCARDIOGRAM COMPLETE
Area-P 1/2: 3.72 cm2
S' Lateral: 2.6 cm

## 2021-08-15 ENCOUNTER — Ambulatory Visit
Admission: RE | Admit: 2021-08-15 | Discharge: 2021-08-15 | Disposition: A | Discharge status: Home / Self Care | Payer: No Typology Code available for payment source | Source: Ambulatory Visit | Attending: Orthopedic Surgery | Admitting: Orthopedic Surgery

## 2021-08-15 DIAGNOSIS — G8929 Other chronic pain: Secondary | ICD-10-CM

## 2021-08-26 ENCOUNTER — Other Ambulatory Visit: Payer: Self-pay | Admitting: Orthopedic Surgery

## 2021-08-26 DIAGNOSIS — G8929 Other chronic pain: Secondary | ICD-10-CM

## 2021-09-23 ENCOUNTER — Ambulatory Visit
Admission: RE | Admit: 2021-09-23 | Discharge: 2021-09-23 | Disposition: A | Discharge status: Home / Self Care | Payer: No Typology Code available for payment source | Source: Ambulatory Visit | Attending: Orthopedic Surgery | Admitting: Orthopedic Surgery

## 2021-09-23 DIAGNOSIS — G8929 Other chronic pain: Secondary | ICD-10-CM

## 2021-10-23 ENCOUNTER — Ambulatory Visit: Payer: No Typology Code available for payment source | Admitting: Neurology

## 2021-11-24 LAB — HEPATIC FUNCTION PANEL
ALT: 20 IU/L (ref 0–32)
AST: 26 IU/L (ref 0–40)
Albumin: 4.8 g/dL (ref 3.9–4.9)
Alkaline Phosphatase: 57 IU/L (ref 44–121)
Bilirubin Total: 0.4 mg/dL (ref 0.0–1.2)
Bilirubin, Direct: 0.13 mg/dL (ref 0.00–0.40)
Total Protein: 7 g/dL (ref 6.0–8.5)

## 2021-11-24 LAB — LIPID PANEL
Chol/HDL Ratio: 2 ratio (ref 0.0–4.4)
Cholesterol, Total: 147 mg/dL (ref 100–199)
HDL: 74 mg/dL (ref >39)
LDL Chol Calc (NIH): 60 mg/dL (ref 0–99)
Triglycerides: 63 mg/dL (ref 0–149)
VLDL Cholesterol Cal: 13 mg/dL (ref 5–40)

## 2021-11-26 ENCOUNTER — Other Ambulatory Visit: Payer: Self-pay | Admitting: Internal Medicine

## 2021-11-26 DIAGNOSIS — R102 Pelvic and perineal pain: Secondary | ICD-10-CM

## 2021-12-02 ENCOUNTER — Telehealth: Payer: Self-pay | Admitting: Cardiology

## 2021-12-02 NOTE — Telephone Encounter (Signed)
Pt c/o medication issue:  1. Name of Medication: Evolocumab (REPATHA SURECLICK) 102 MG/ML SOAJ  2. How are you currently taking this medication (dosage and times per day)?   3. Are you having a reaction (difficulty breathing--STAT)?   4. What is your medication issue? Need to call Terie Purser: [phone] for prior auth. Pt also states she needs a call back to discuss this pharmacy. She also states this is needing a prior auth.  Patient is requesting call back to discuss pharmacy. Completely out of this medication.

## 2021-12-02 NOTE — Telephone Encounter (Signed)
Pt would like a callback from Pharmacist. Please advise

## 2021-12-02 NOTE — Telephone Encounter (Signed)
Spoke with patient. Advised that we submitted the PA this AM. Patient states that previously she was on a program with [facility] where she received it for free. Advised that I do not know if this will be the case, but we have a copay card in her Rx if she need it. Will call pt once I hear back from insurance.

## 2021-12-02 NOTE — Telephone Encounter (Signed)
PA submitted Key: [id number]

## 2021-12-02 NOTE — Telephone Encounter (Signed)
Patient stated she is out of Repatha and needs another preauth for more medication.

## 2021-12-03 ENCOUNTER — Ambulatory Visit
Admission: RE | Admit: 2021-12-03 | Discharge: 2021-12-03 | Disposition: A | Discharge status: Home / Self Care | Payer: No Typology Code available for payment source | Source: Ambulatory Visit | Attending: Internal Medicine | Admitting: Internal Medicine

## 2021-12-03 DIAGNOSIS — R102 Pelvic and perineal pain: Secondary | ICD-10-CM

## 2021-12-03 NOTE — Telephone Encounter (Signed)
PA has been approved. No expiration date given. Patient made aware. She said the date they gave her was June 20 something of 2024.

## 2022-01-15 ENCOUNTER — Telehealth: Payer: Self-pay | Admitting: Pharmacist Clinician (PhC)/ Clinical Pharmacy Specialist

## 2022-01-15 NOTE — Telephone Encounter (Signed)
Patient called to report that she has been having lower back problems for awhile and before going through some testing recommended by her MD, would like to know if she could stop the [employer] for a few weeks to see if this is the problem.  CV history significant for TIA about a year ago.  Assured her that she should be fine to skip next 2 doses of Repatha to determine if symptoms go away.  She is willing to re-challenge if the symptoms fade away, to confirm if this is problem.  Asked that she reach out in 6-8 weeks and let me know how things are going.

## 2022-01-20 ENCOUNTER — Other Ambulatory Visit: Payer: Self-pay | Admitting: Internal Medicine

## 2022-01-20 ENCOUNTER — Encounter: Payer: Self-pay | Admitting: Internal Medicine

## 2022-01-20 DIAGNOSIS — R102 Pelvic and perineal pain: Secondary | ICD-10-CM

## 2022-03-23 ENCOUNTER — Encounter: Payer: Self-pay | Admitting: Nurse Practitioner

## 2022-03-23 ENCOUNTER — Ambulatory Visit (INDEPENDENT_AMBULATORY_CARE_PROVIDER_SITE_OTHER): Payer: No Typology Code available for payment source | Admitting: Nurse Practitioner

## 2022-03-23 VITALS — BP 110/78 | HR 78 | Ht 63.5 in | Wt 145.0 lb

## 2022-03-23 DIAGNOSIS — M8589 Other specified disorders of bone density and structure, multiple sites: Secondary | ICD-10-CM | POA: Diagnosis not present

## 2022-03-23 DIAGNOSIS — Z780 Asymptomatic menopausal state: Secondary | ICD-10-CM | POA: Diagnosis not present

## 2022-03-23 DIAGNOSIS — Z01419 Encounter for gynecological examination (general) (routine) without abnormal findings: Secondary | ICD-10-CM | POA: Diagnosis not present

## 2022-03-23 NOTE — Progress Notes (Signed)
   Lori Wilcox [birth year] [id number]   History:  62 y.o. G0 presents for annual exam. Postmenopausal - no HRT, no bleeding. Normal pap and mammogram history. H/O TIA November 2022. Seeing GI for + hemoccult. Doing PT for pelvic pain, likely d/t issues with SI joint, has had improvement with PT.   Gynecologic History Patient's last menstrual period was 02/01/2012.   Contraception/Family planning: post menopausal status Sexually active: No  Health Maintenance Last Pap: 03/20/2021. Results were: Normal neg HPV Last mammogram: 06/27/2021. Results were: Normal Last colonoscopy: 2022. Results were: Benign polyps, 10-year recall Last Dexa: 11/12/2020. Results were: T-score -1.6, FRAX 8.4% / 0.8%  Past medical history, past surgical history, family history and social history were all reviewed and documented in the EPIC chart. Single. 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 Mother diagnosed with breast cancer at 54.  ROS:  A ROS was performed and pertinent positives and negatives are included.  Exam:  Vitals:   03/23/22 1154  BP: 110/78  Pulse: 78  Weight: 145 lb (65.8 kg)  Height: 5' 3.5" (1.613 m)     Body mass index is 25.28 kg/m.  General appearance:  Normal Thyroid:  Symmetrical, normal in size, without palpable masses or nodularity. Respiratory  Auscultation:  Clear without wheezing or rhonchi Cardiovascular  Auscultation:  Regular rate, without rubs, murmurs or gallops  Edema/varicosities:  Not grossly evident Abdominal  Soft,nontender, without masses, guarding or rebound.  Liver/spleen:  No organomegaly noted  Hernia:  None appreciated  Skin  Inspection:  Grossly normal   Breasts: Examined lying and sitting.   Right: Without masses, retractions, discharge or axillary adenopathy.   Left: Without masses, retractions, discharge or axillary adenopathy. Genitourinary   Inguinal/mons:  Normal without inguinal adenopathy  External  genitalia:  Normal appearing vulva with no masses, tenderness, or lesions  BUS/Urethra/Skene's glands:  Normal  Vagina:  Normal appearing with normal color and discharge, no lesions  Cervix:  Normal appearing without discharge or lesions. Atrophic changes  Uterus:  Normal in size, shape and contour.  Midline and mobile, nontender  Adnexa/parametria:     Rt: Normal in size, without masses or tenderness.   Lt: Normal in size, without masses or tenderness.  Anus and perineum: Normal  Digital rectal exam: Deferred  Patient informed chaperone available to be present for breast and pelvic exam. Patient has requested no chaperone to be present. Patient has been advised what will be completed during breast and pelvic exam.   Assessment/Plan:  62 y.o. G0 for annual exam.   Well female exam with routine gynecological exam - Education provided on SBEs, importance of preventative screenings, current guidelines, high calcium diet, regular exercise, and multivitamin daily.  Labs done with cardiology.   Postmenopausal - no HRT, no bleeding  Osteopenia of multiple sites - 10/2020 T-score -1.6 without elevated FRAX. Continue Vitamin D supplement, she consumes high calcium diet. Also recommend regular exercise with resistance training. DXA due in October.   Screening for cervical cancer - Normal Pap history.  Will repeat at 5-year interval per guidelines.   Screening for breast cancer - Normal mammogram history.  Continue annual screenings.  Normal breast exam today.   Screening for colon cancer - 2022 colonoscopy. Will repeat at 10-year interval per GI's recommendation.   Follow up in 1 year for annual.      Tamela Gammon [facility], 12:20 PM 03/23/2022

## 2022-06-23 ENCOUNTER — Other Ambulatory Visit: Payer: Self-pay | Admitting: Orthopedic Surgery

## 2022-06-23 DIAGNOSIS — M5459 Other low back pain: Secondary | ICD-10-CM

## 2022-06-29 ENCOUNTER — Other Ambulatory Visit: Payer: Self-pay | Admitting: Pharmacist

## 2022-06-29 MED ORDER — REPATHA SURECLICK 140 MG/ML ~~LOC~~ SOAJ: 140.0000 mg | SUBCUTANEOUS | 3 refills | Status: DC

## 2022-06-29 NOTE — Progress Notes (Signed)
Received refill request from a [facility] in [place] - called pt to clarify, her insurance mandates [facility] fills, apparently this is a new mail order pharmacy in [place] they're piloting? They ship med to pt at no copay, will refill.

## 2022-07-30 ENCOUNTER — Telehealth: Payer: Self-pay | Admitting: Cardiology

## 2022-07-30 ENCOUNTER — Other Ambulatory Visit: Payer: Self-pay | Admitting: Cardiology

## 2022-07-30 NOTE — Telephone Encounter (Signed)
*  STAT* If patient is at the pharmacy, call can be transferred to refill team.   1. Which medications need to be refilled? (please list name of each medication and dose if known) Evolocumab (REPATHA SURECLICK) 140 MG/ML SOAJ   2. Which pharmacy/location (including street and city if local pharmacy) is medication to be sent to? [facility] [place] - [address] AT [address] mail service   3. Do they need a 30 day or 90 day supply? 90

## 2022-07-31 MED ORDER — REPATHA SURECLICK 140 MG/ML ~~LOC~~ SOAJ: 140.0000 mg | SUBCUTANEOUS | 3 refills | Status: DC

## 2022-08-03 ENCOUNTER — Ambulatory Visit
Admission: RE | Admit: 2022-08-03 | Discharge: 2022-08-03 | Disposition: A | Discharge status: Home / Self Care | Payer: No Typology Code available for payment source | Source: Ambulatory Visit | Attending: Orthopedic Surgery | Admitting: Orthopedic Surgery

## 2022-08-03 DIAGNOSIS — M5459 Other low back pain: Secondary | ICD-10-CM

## 2022-08-03 MED ORDER — METHYLPREDNISOLONE ACETATE 80 MG/ML IJ SUSP: 80.0000 mg | Freq: Once | INTRAMUSCULAR | Status: DC

## 2022-08-03 NOTE — Progress Notes (Signed)
Cardiology Clinic Note   Patient Name: Lori Wilcox Date of Encounter: 08/10/2022  Primary Care Provider:  Thana Ates, MD Primary Cardiologist:  Peter Swaziland, MD  Patient Profile    62 year old female with hx of TIA, HL with statin intolerance on Repatha, coronary CTA calcium score of 0, 2023, carotid artery disease. Last seen by Dr. Swaziland 01/21/2021.   Past Medical History    Past Medical History:  Diagnosis Date   Acid reflux    Broken finger 2005   Carotid artery disease (HCC)    Fatigue 09/17/2010   VITAMIN D DEFICIENCY ?DEPRESSION   Hepatic steatosis    Hypercholesteremia    Insomnia    Liver lesion, right lobe    Osteopenia 10/2018   T score -1.4 FRAX 7% / 0.5%   Seasonal allergic rhinitis due to pollen    TIA (transient ischemic attack)    Past Surgical History:  Procedure Laterality Date   GANGLION CYST EXCISION     LAPAROSCOPIC APPENDECTOMY  2000   SHOULDER SURGERY  2006   TMJ ARTHROPLASTY  1986    Allergies  Allergies  Allergen Reactions   Ezetimibe     MIGRAINES, MUSCLE ACHES   Atorvastatin Other (See Comments)    Severe headaches, body aches, constipation   Rosuvastatin Other (See Comments)    Severe headaches, body aches, constipation    History of Present Illness    Lori Wilcox comes today for follow-up and renewal of prescription for Repatha.  She has lost approximately 20 pounds with intermittent fasting eating only and a 4-hour window each day.  This took her about a year and a half she feels better, is more active she does endurance horseback racing, and is training for a race in September at [place].  This involves a lot of physical activity.  She denies any chest pain, dyspnea on exertion, or fatigue.  She is medically compliant.  Home Medications    Current Outpatient Medications  Medication Sig Dispense Refill   ASPIRIN LOW DOSE PO Take 1 tablet by mouth daily.     Coenzyme Q10 (CO Q10) 100 MG CAPS Take 1 tablet by mouth daily. Take  one capsule by mouth daily     Multiple Vitamin (MULTIVITAMIN) capsule Take 1 capsule by mouth daily.     VITAMIN D, CHOLECALCIFEROL, PO Take 2,000 Int'l Units/day by mouth daily.     Vitamin Mixture (VITAMIN E COMPLETE) CAPS Take 1 capsule by mouth daily.     zolpidem (AMBIEN) 10 MG tablet Take 1 tablet (10 mg total) by mouth at bedtime as needed. 30 tablet 1   Evolocumab (REPATHA SURECLICK) 140 MG/ML SOAJ Inject 140 mg into the skin every 14 (fourteen) days. 6 mL 3   ezetimibe (ZETIA) 10 MG tablet TAKE 1 TABLET BY MOUTH EVERY DAY (Patient not taking: Reported on 08/10/2022) 90 tablet 0   metoprolol tartrate (LOPRESSOR) 100 MG tablet Take 100 mg 2 hours before coronary ct (Patient not taking: Reported on 08/10/2022) 1 tablet 0   No current facility-administered medications for this visit.     Family History    Family History  Problem Relation Age of Onset   Lung cancer Mother    Breast cancer Mother 79   COPD Mother    Liver disease Brother    Alpha-1 antitrypsin deficiency Brother    Uterine cancer Maternal Grandmother    Colon cancer Maternal Grandfather    Colon cancer Paternal Grandfather    Heart failure  Paternal Grandfather    She indicated that her mother is deceased. She indicated that her father is alive. She indicated that her brother is deceased. She indicated that the status of her maternal grandmother is unknown. She indicated that the status of her maternal grandfather is unknown. She indicated that the status of her paternal grandfather is unknown and reported the following: died in his late 44's.  Social History    Social History   Socioeconomic History   Marital status: Divorced    Spouse name: Not on file   Number of children: Not on file   Years of education: Not on file   Highest education level: Not on file  Occupational History   Not on file  Tobacco Use   Smoking status: Former   Smokeless tobacco: Never  Vaping Use   Vaping Use: Never used   Substance and Sexual Activity   Alcohol use: Yes    Alcohol/week: 0.0 standard drinks of alcohol    Comment: OCCASIONAL SOCIAL   Drug use: No   Sexual activity: Not Currently    Comment: DENIED INSURANCE QUESTIONS  Other Topics Concern   Not on file  Social History Narrative   CPA/CFO   Social Determinants of Health   Financial Resource Strain: Not on file  Food Insecurity: Not on file  Transportation Needs: Not on file  Physical Activity: Not on file  Stress: Not on file  Social Connections: Not on file  Intimate Partner Violence: Not on file     Review of Systems    General:  No chills, fever, night sweats or positive for purposeful weight loss.  Cardiovascular:  No chest pain, dyspnea on exertion, edema, orthopnea, palpitations, paroxysmal nocturnal dyspnea. Dermatological: No rash, lesions/masses Respiratory: No cough, dyspnea Urologic: No hematuria, dysuria Abdominal:   No nausea, vomiting, diarrhea, bright red blood per rectum, melena, or hematemesis Neurologic:  No visual changes, wkns, changes in mental status. All other systems reviewed and are otherwise negative except as noted above.     Physical Exam    VS:  BP (!) 112/58 (BP Location: Left Arm, Patient Position: Sitting, Cuff Size: Normal)   Pulse 62   Ht 5\' 5"  (1.651 m)   Wt 140 lb 3.2 oz (63.6 kg)   LMP 02/01/2012   SpO2 98%   BMI 23.33 kg/m  , BMI Body mass index is 23.33 kg/m.     GEN: Well nourished, well developed, in no acute distress. HEENT: normal. Neck: Supple, no JVD, carotid bruits, or masses. Cardiac: RRR, no murmurs, rubs, or gallops. No clubbing, cyanosis, edema.  Radials/DP/PT 2+ and equal bilaterally.  Respiratory:  Respirations regular and unlabored, clear to auscultation bilaterally. GI: Soft, nontender, nondistended, BS + x 4. MS: no deformity or atrophy. Skin: warm and dry, no rash. Neuro:  Strength and sensation are intact. Psych: Normal affect.  Accessory Clinical  Findings  EKG (personally reviewed).  Normal sinus rhythm with sinus arrhythmia, heart rate of 62 bpm.  No change compared to April 2005.  Lab Results  Component Value Date   WBC 4.9 03/02/2017   HGB 12.2 03/02/2017   HCT 35.8 03/02/2017   MCV 88.4 03/02/2017   PLT 208 03/02/2017   Lab Results  Component Value Date   CREATININE 0.78 03/05/2021   BUN 16 03/05/2021   NA 144 03/05/2021   K 4.7 03/05/2021   CL 106 03/05/2021   CO2 25 03/05/2021   Lab Results  Component Value Date   ALT 20  11/24/2021   AST 26 11/24/2021   ALKPHOS 57 11/24/2021   BILITOT 0.4 11/24/2021   Lab Results  Component Value Date   CHOL 147 11/24/2021   HDL 74 11/24/2021   LDLCALC 60 11/24/2021   TRIG 63 11/24/2021   CHOLHDL 2.0 11/24/2021    Lab Results  Component Value Date   HGBA1C 5.6 03/11/2021    Review of Prior Studies Echocardiogram 08/11/2021  1. Left ventricular ejection fraction, by estimation, is 55 to 60%. The  left ventricle has normal function. The left ventricle has no regional  wall motion abnormalities. Left ventricular diastolic parameters were  normal.   2. Right ventricular systolic function is normal. The right ventricular  size is normal. Tricuspid regurgitation signal is inadequate for assessing  PA pressure.   3. The mitral valve is normal in structure. Trivial mitral valve  regurgitation. No evidence of mitral stenosis.   4. The aortic valve is tricuspid. Aortic valve regurgitation is not  visualized. No aortic stenosis is present.   5. The inferior vena cava is normal in size with greater than 50%  respiratory variability, suggesting right atrial pressure of 3 mmHg.    Assessment & Plan   1.  Hyperlipidemia: She remains on Repatha and is due for refills.  I will have her follow-up with pharmacist for ongoing management as well.  I will repeat fasting lipids and LFTs for evaluation of response to medication.  She has lost a good bit of weight and is eating much  more healthy.  2.  History of TIA: She remains on low-dose aspirin 81 mg daily.  No further symptoms.          Signed, Bettey Mare. Liborio Nixon, ANP, AACC   08/10/2022 4:03 PM      Office [phone] Fax [phone]  Notice: This dictation was prepared with Dragon dictation along with smaller phrase technology. Any transcriptional errors that result from this process are unintentional and may not be corrected upon review.

## 2022-08-10 ENCOUNTER — Ambulatory Visit: Payer: No Typology Code available for payment source | Attending: Adult Health | Admitting: Adult Health

## 2022-08-10 ENCOUNTER — Encounter: Payer: Self-pay | Admitting: Adult Health

## 2022-08-10 VITALS — BP 112/58 | HR 62 | Ht 65.0 in | Wt 140.2 lb

## 2022-08-10 DIAGNOSIS — E7801 Familial hypercholesterolemia: Secondary | ICD-10-CM

## 2022-08-10 DIAGNOSIS — Z8673 Personal history of transient ischemic attack (TIA), and cerebral infarction without residual deficits: Secondary | ICD-10-CM

## 2022-08-10 MED ORDER — REPATHA SURECLICK 140 MG/ML ~~LOC~~ SOAJ: 140.0000 mg | SUBCUTANEOUS | 3 refills | Status: DC

## 2022-08-10 NOTE — Patient Instructions (Signed)
Medication Instructions:  No Changes *If you need a refill on your cardiac medications before your next appointment, please call your pharmacy*   Lab Work: Lipid Panel, CMET. If you have labs (blood work) drawn today and your tests are completely normal, you will receive your results only by: MyChart Message (if you have MyChart) OR A paper copy in the mail If you have any lab test that is abnormal or we need to change your treatment, we will call you to review the results.   Testing/Procedures: No Testing   Follow-Up: At [facility], you and your health needs are our priority.  As part of our continuing mission to provide you with exceptional heart care, we have created designated Provider Care Teams.  These Care Teams include your primary Cardiologist (physician) and Advanced Practice Providers (APPs -  Physician Assistants and Nurse Practitioners) who all work together to provide you with the care you need, when you need it.  We recommend signing up for the patient portal called "MyChart".  Sign up information is provided on this After Visit Summary.  MyChart is used to connect with patients for Virtual Visits (Telemedicine).  Patients are able to view lab/test results, encounter notes, upcoming appointments, etc.  Non-urgent messages can be sent to your provider as well.   To learn more about what you can do with MyChart, go to [email or web address].    Your next appointment:   1 year    Provider:   Peter Swaziland, MD   Pharm-D ( First Available).

## 2022-08-14 ENCOUNTER — Other Ambulatory Visit: Payer: Self-pay | Admitting: Obstetrics and Gynecology

## 2022-08-14 DIAGNOSIS — Z1231 Encounter for screening mammogram for malignant neoplasm of breast: Secondary | ICD-10-CM

## 2022-08-21 ENCOUNTER — Ambulatory Visit
Admission: RE | Admit: 2022-08-21 | Discharge: 2022-08-21 | Disposition: A | Discharge status: Home / Self Care | Payer: No Typology Code available for payment source | Source: Ambulatory Visit | Attending: Obstetrics and Gynecology | Admitting: Obstetrics and Gynecology

## 2022-08-21 DIAGNOSIS — Z1231 Encounter for screening mammogram for malignant neoplasm of breast: Secondary | ICD-10-CM

## 2022-08-31 ENCOUNTER — Telehealth: Payer: Self-pay

## 2022-08-31 NOTE — Telephone Encounter (Signed)
Pt LVM in triage line stating that she received recall letter for DEXA and desires to schedule.

## 2022-09-02 NOTE — Telephone Encounter (Signed)
AEX w/ TW on 03/23/2022. DEXA due after 11/13/2022  Pt offered [facility] and [facility] as alternative locations for DEXA scan to be performed. Pt states she is going to contact insurance and think on it and will let us know where she decides to go so that we can send order.

## 2022-09-02 NOTE — Progress Notes (Deleted)
Patient ID: [name]                 DOB: 08-23-1960                    MRN: [id number]      HPI: Lori Wilcox is a 62 y.o. female patient referred to lipid clinic by Joni Reining, NP. PMH is significant for x of TIA, HL with statin intolerance on Repatha, coronary CTA calcium score of 0, 2023, carotid artery disease.  Lost 25 lbs  Reviewed options for lowering LDL cholesterol, including ezetimibe, PCSK-9 inhibitors, bempedoic acid and inclisiran.  Discussed mechanisms of action, dosing, side effects and potential decreases in LDL cholesterol.  Also reviewed cost information and potential options for patient assistance.  Current Medications: Repatha 140 mg  Intolerances:  Risk Factors:  LDL goal: <70   Diet: healthy diet   Exercise:   Family History:   Social History:   Labs: Lipid Panel     Component Value Date/Time   CHOL 147 11/24/2021 0848   TRIG 63 11/24/2021 0848   HDL 74 11/24/2021 0848   CHOLHDL 2.0 11/24/2021 0848   CHOLHDL 2.5 03/02/2017 1225   VLDL 12 04/28/2016 1146   LDLCALC 60 11/24/2021 0848   LDLCALC 117 (H) 03/02/2017 1225   LABVLDL 13 11/24/2021 0848    Past Medical History:  Diagnosis Date   Acid reflux    Broken finger 2005   Carotid artery disease (HCC)    Fatigue 09/17/2010   VITAMIN D DEFICIENCY ?DEPRESSION   Hepatic steatosis    Hypercholesteremia    Insomnia    Liver lesion, right lobe    Osteopenia 10/2018   T score -1.4 FRAX 7% / 0.5%   Seasonal allergic rhinitis due to pollen    TIA (transient ischemic attack)     Current Outpatient Medications on File Prior to Visit  Medication Sig Dispense Refill   ASPIRIN LOW DOSE PO Take 1 tablet by mouth daily.     Coenzyme Q10 (CO Q10) 100 MG CAPS Take 1 tablet by mouth daily. Take one capsule by mouth daily     Evolocumab (REPATHA SURECLICK) 140 MG/ML SOAJ Inject 140 mg into the skin every 14 (fourteen) days. 6 mL 3   ezetimibe (ZETIA) 10 MG tablet TAKE 1 TABLET BY MOUTH EVERY DAY  (Patient not taking: Reported on 08/10/2022) 90 tablet 0   metoprolol tartrate (LOPRESSOR) 100 MG tablet Take 100 mg 2 hours before coronary ct (Patient not taking: Reported on 08/10/2022) 1 tablet 0   Multiple Vitamin (MULTIVITAMIN) capsule Take 1 capsule by mouth daily.     VITAMIN D, CHOLECALCIFEROL, PO Take 2,000 Int'l Units/day by mouth daily.     Vitamin Mixture (VITAMIN E COMPLETE) CAPS Take 1 capsule by mouth daily.     zolpidem (AMBIEN) 10 MG tablet Take 1 tablet (10 mg total) by mouth at bedtime as needed. 30 tablet 1   No current facility-administered medications on file prior to visit.    Allergies  Allergen Reactions   Ezetimibe     MIGRAINES, MUSCLE ACHES   Atorvastatin Other (See Comments)    Severe headaches, body aches, constipation   Rosuvastatin Other (See Comments)    Severe headaches, body aches, constipation    Assessment/Plan:  1. Hyperlipidemia -  No problems updated. No problem-specific Assessment & Plan notes found for this encounter.    Thank you,  Carmela Hurt, Pharm.D [facility] A Division of  [facility] [address] [place], [place] [address]  Phone: [phone]; Fax: [phone]

## 2022-09-03 ENCOUNTER — Ambulatory Visit: Payer: No Typology Code available for payment source

## 2022-09-04 ENCOUNTER — Telehealth: Payer: Self-pay | Admitting: Cardiology

## 2022-09-04 LAB — LIPID PANEL
Chol/HDL Ratio: 1.9 ratio (ref 0.0–4.4)
Cholesterol, Total: 167 mg/dL (ref 100–199)
HDL: 89 mg/dL (ref >39)
LDL Chol Calc (NIH): 66 mg/dL (ref 0–99)
Triglycerides: 61 mg/dL (ref 0–149)
VLDL Cholesterol Cal: 12 mg/dL (ref 5–40)

## 2022-09-04 LAB — COMPREHENSIVE METABOLIC PANEL
ALT: 16 IU/L (ref 0–32)
AST: 25 IU/L (ref 0–40)
Albumin: 4.7 g/dL (ref 3.9–4.9)
Alkaline Phosphatase: 52 IU/L (ref 44–121)
BUN/Creatinine Ratio: 25 (ref 12–28)
BUN: 19 mg/dL (ref 8–27)
Bilirubin Total: 0.3 mg/dL (ref 0.0–1.2)
CO2: 26 mmol/L (ref 20–29)
Calcium: 10 mg/dL (ref 8.7–10.3)
Chloride: 104 mmol/L (ref 96–106)
Creatinine, Ser: 0.77 mg/dL (ref 0.57–1.00)
Globulin, Total: 2.2 g/dL (ref 1.5–4.5)
Glucose: 85 mg/dL (ref 70–99)
Potassium: 5.2 mmol/L (ref 3.5–5.2)
Sodium: 142 mmol/L (ref 134–144)
Total Protein: 6.9 g/dL (ref 6.0–8.5)
eGFR: 87 mL/min/{1.73_m2} (ref >59)

## 2022-09-04 MED ORDER — REPATHA SURECLICK 140 MG/ML ~~LOC~~ SOAJ: 140.0000 mg | SUBCUTANEOUS | 3 refills | Status: DC

## 2022-09-04 NOTE — Telephone Encounter (Signed)
Pt c/o medication issue:  1. Name of Medication:   Evolocumab (REPATHA SURECLICK) 140 MG/ML SOAJ   2. How are you currently taking this medication (dosage and times per day)?   3. Are you having a reaction (difficulty breathing--STAT)?   4. What is your medication issue?   Patient stated she needs a refill sent to [facility] - [place] - [address] AT [facility] mail services now that her blood test results are in.

## 2022-09-07 ENCOUNTER — Telehealth: Payer: Self-pay

## 2022-09-07 NOTE — Telephone Encounter (Signed)
Pt LVM in triage line on 09/04/2022 stating that she scheduled her DEXA scan to be done @ [facility] on 11/16/2022.   Needs order sent over.   Order written up and placed on provider's desk for authorization.

## 2022-09-07 NOTE — Telephone Encounter (Signed)
Order signed, faxed successfully to [facility], and pt has been notified. Will route to provider for final review and close.

## 2022-09-07 NOTE — Telephone Encounter (Addendum)
Called patient regarding results . Left detailed message for patient regarding results. Letter mailed 09/07/22.----- Message from Joni Reining sent at 09/04/2022  7:51 AM EDT ----- I have reviewed the labs.  All are normal.  No changes in her regimine.

## 2022-11-05 IMAGING — MG DIGITAL DIAGNOSTIC BILAT W/ TOMO W/ CAD
6 of 10 series · 6 of 30 positions shown · non-contrast
Comparison: Previous exam(s).

CLINICAL DATA: 61-year-old female presenting for evaluation of a
palpable lump in the right breast for the past few months. She has
family history of breast cancer in her mother who was initially
diagnosed in her 50s.

EXAM:
DIGITAL DIAGNOSTIC BILATERAL MAMMOGRAM WITH TOMOSYNTHESIS AND CAD;
ULTRASOUND RIGHT BREAST LIMITED
TECHNIQUE: Bilateral digital diagnostic mammography and breast tomosynthesis
was performed. The images were evaluated with computer-aided
detection.; Targeted ultrasound examination of the right breast was
performed

[L MLO synth-2D]
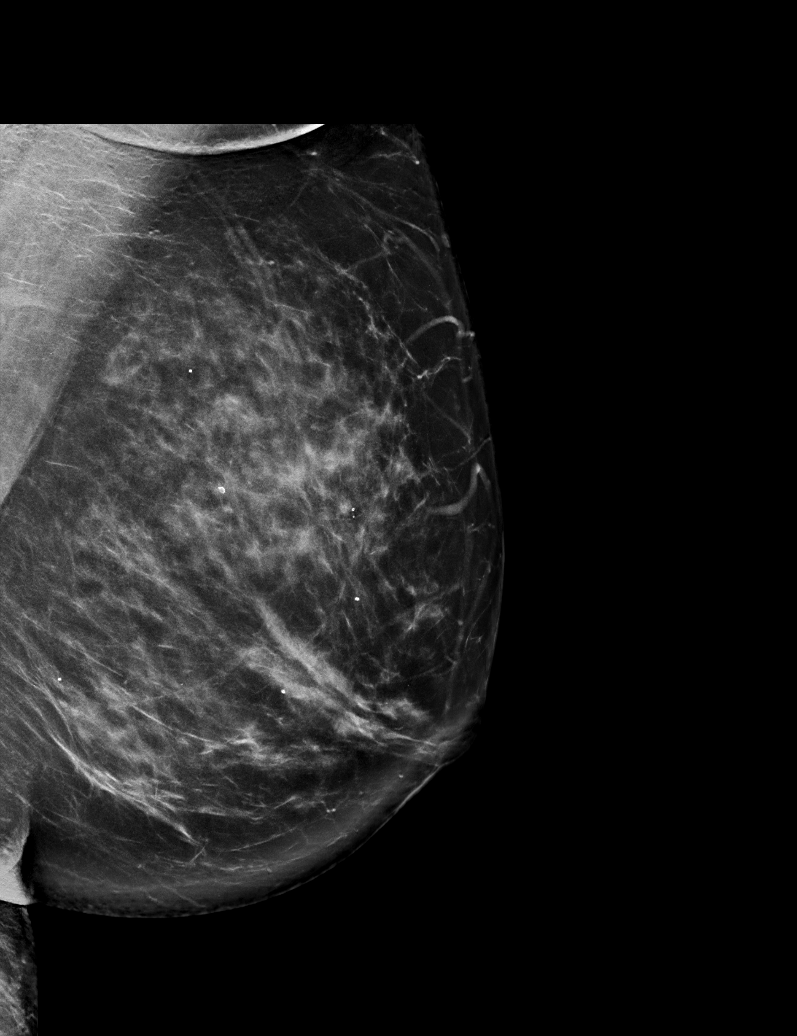

[R CC synth-2D]
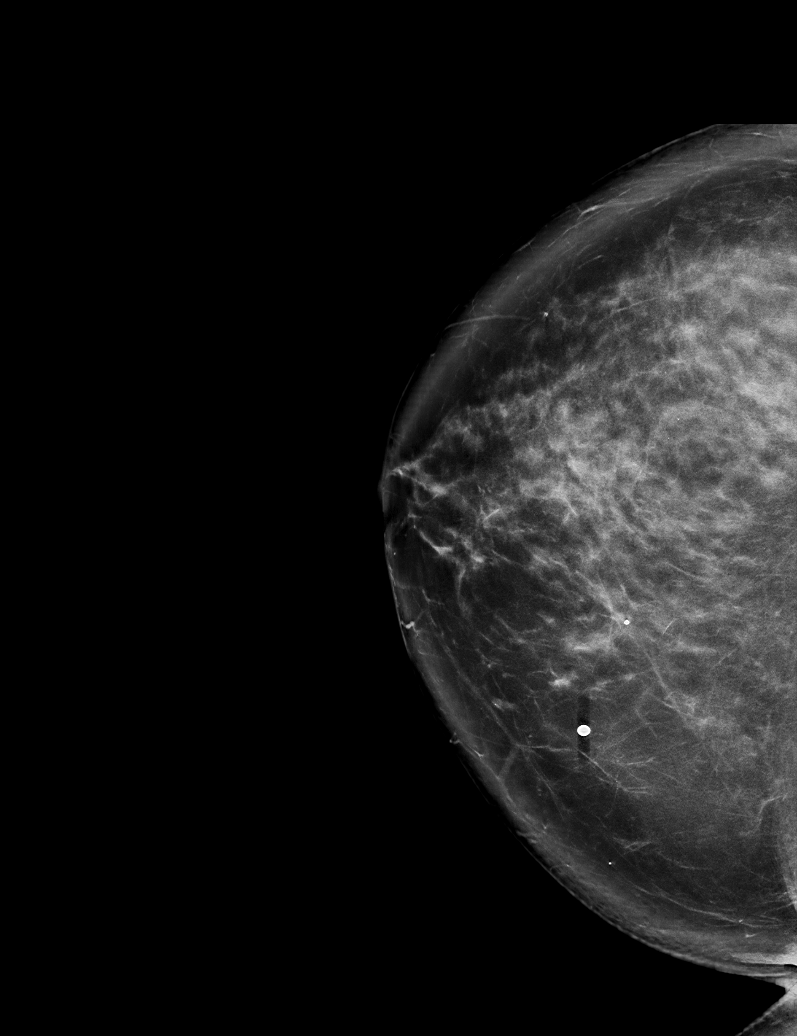

[R MLO synth-2D (1 of 2)]
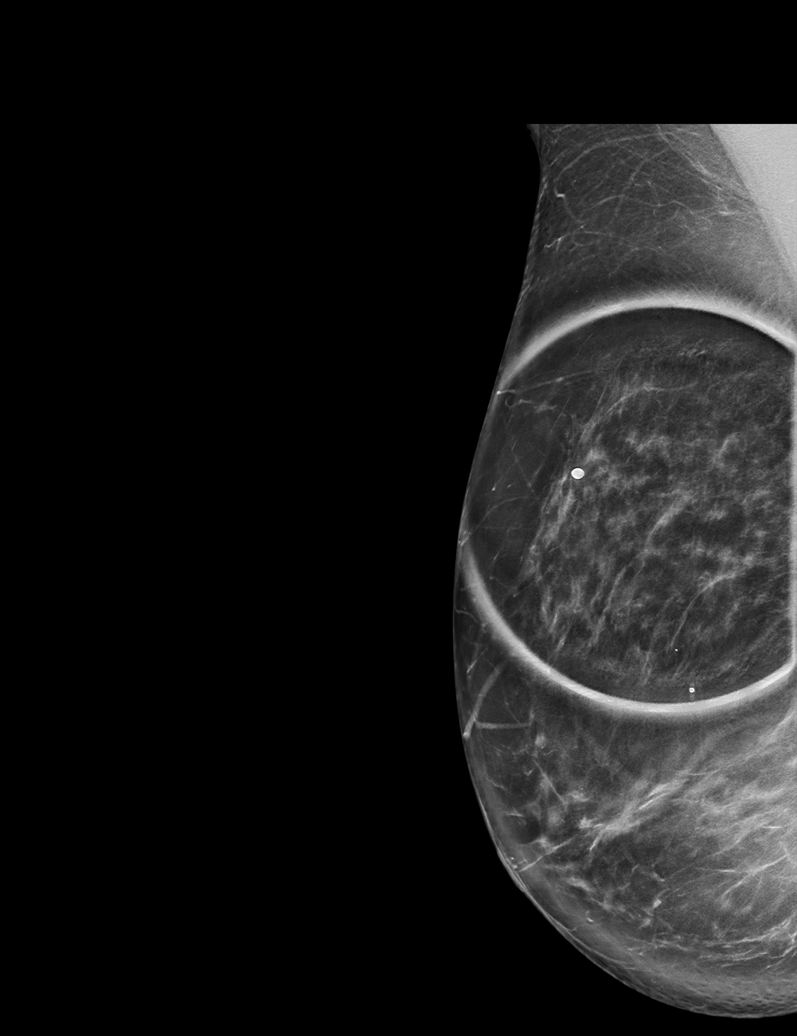

[R MLO synth-2D (2 of 2)]
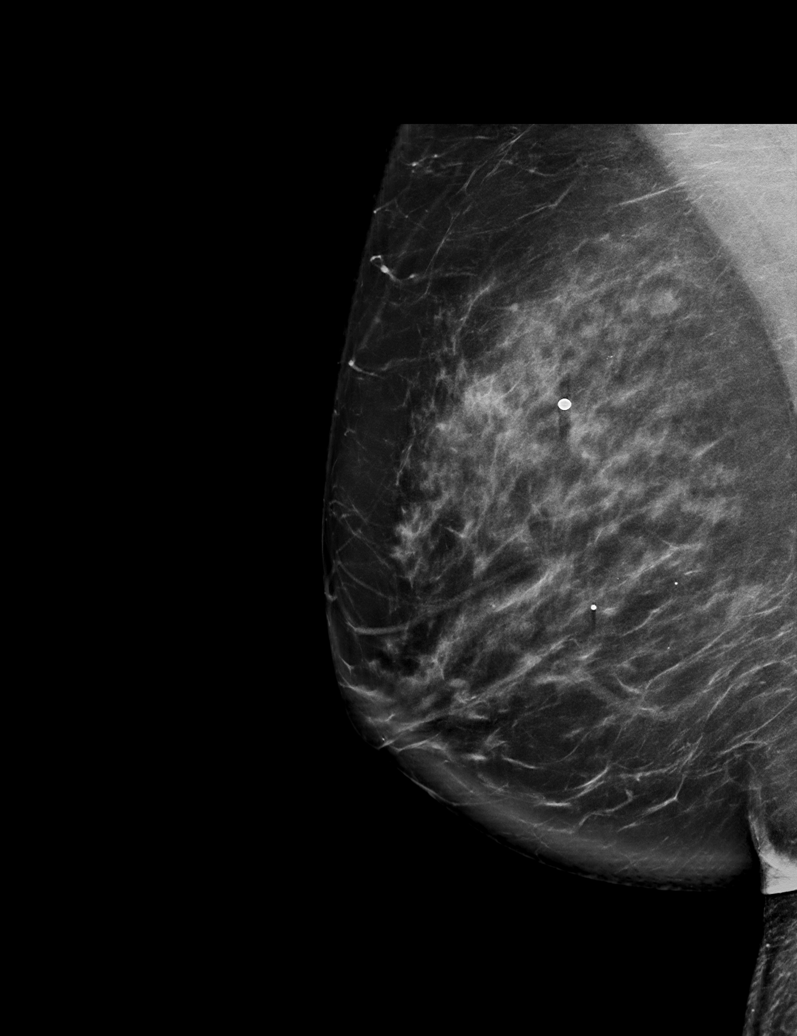

[L CC synth-2D]
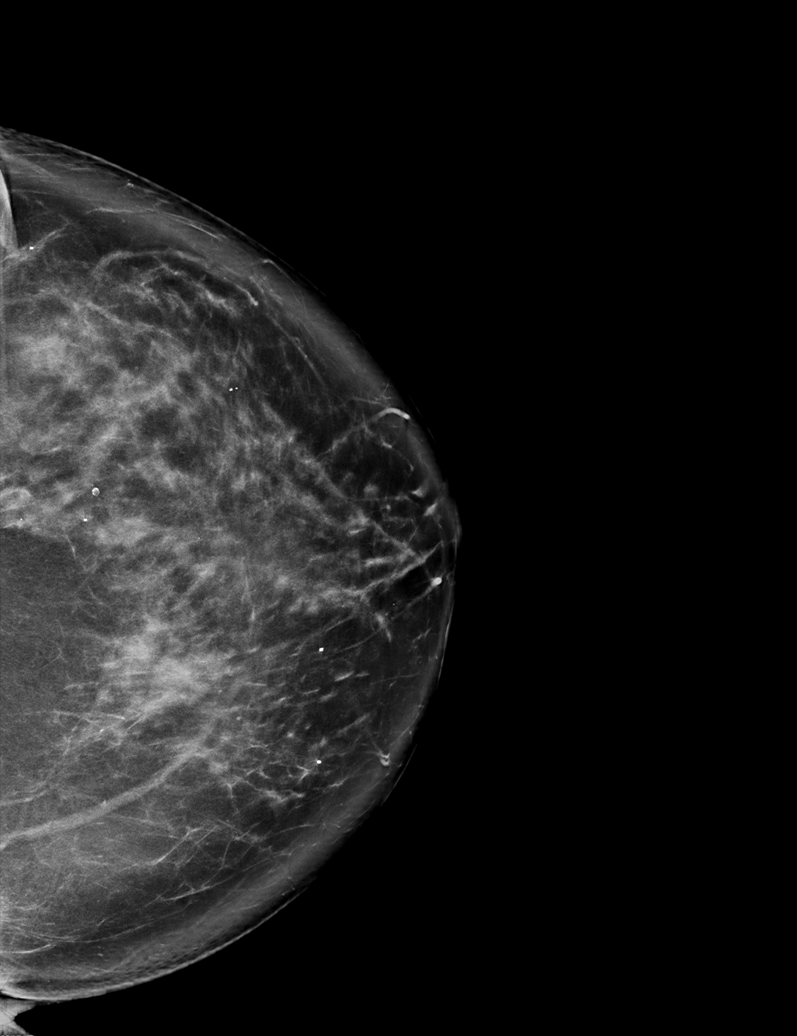

[R MLO tomo · tomo slice 47/93.0]
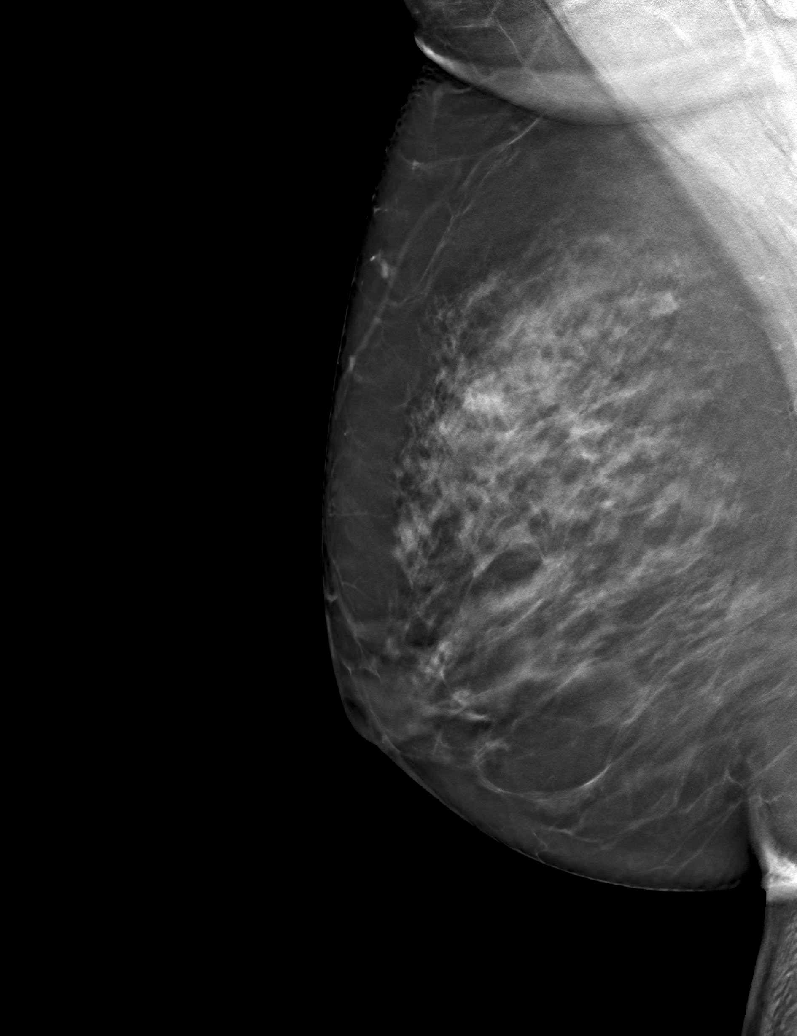

[6 of 30 positions shown; findings below may reference images not displayed]

ACR Breast Density Category c: The breast tissue is heterogeneously
dense, which may obscure small masses.
FINDINGS: A BB indicating the palpable site of concern has been placed along
the upper inner quadrant of the right breast. There are no
suspicious findings deep to this palpable marker. No suspicious
calcifications, masses or areas of distortion are seen in the
bilateral breasts.

Physical exam of the palpable site in the right breast demonstrates
a smooth superficial ridge of tissue without a discrete palpable
mass.

Ultrasound targeted to the palpable site in the right breast at 1
o'clock, 8 cm from the nipple demonstrates no suspicious masses.
There is a small amount of superficial echogenic tissue, likely some
fat necrosis/inflammation. No abnormal lymph nodes are found in the
right axilla.
IMPRESSION: 1. There are no suspicious mammographic or targeted sonographic
abnormalities at the palpable site of concern in the right breast.

2.  No mammographic evidence of malignancy in the bilateral breasts.

RECOMMENDATION:
1. Clinical follow-up recommended for the palpable area of concern
in the right breast. Any further workup should be based on clinical
grounds.

2.  Screening mammogram in one year.(Code:93-Q-WLV)

I have discussed the findings and recommendations with the patient.
If applicable, a reminder letter will be sent to the patient
regarding the next appointment.

BI-RADS CATEGORY  2: Benign.

## 2022-11-26 ENCOUNTER — Telehealth: Payer: Self-pay

## 2022-11-26 NOTE — Telephone Encounter (Signed)
Pt LVM in triage line requesting results from DEXA scan.

## 2022-11-27 NOTE — Telephone Encounter (Signed)
I did not receive a copy of the patient's bone density.   Please contact [facility] and request this.

## 2022-11-27 NOTE — Telephone Encounter (Signed)
No BMD results in EPIC. Spoke with patient. BMD completed at [facility] 11/16/22. Advised I will f/u with Dr. Edward Jolly to determine if copy of report received, if not our office will request and f/u with results once reviewed. Patient agreeable.   Routing to Dr. Edward Jolly -did you receive BMD report?

## 2022-11-30 NOTE — Telephone Encounter (Signed)
Report received and placed in Dr. Rica Records in basket for review.

## 2022-11-30 NOTE — Telephone Encounter (Signed)
Lori Wilcox @ [facility] and requested DEXA report be sent to [place].   She reported that she put in the request for this to be sent to [place].

## 2022-12-01 NOTE — Telephone Encounter (Signed)
Please contact patient with results of her bone density from [facility] from 11/16/22.   She has osteopenia of the hips and spine.   She does not have osteoporosis.   Her FRAX 10 year probability of fracture is 7.5% for any major osteoporotic fracture and 0.7% for hip fracture.  These are considered low risks.  No prescription medication is needed at this time.  Daily requirements for calcium either through foods or supplements are 1200 mg.   Daily requirements for vitamin D are at least 600 - 800 international units.   Weight bearing exercise is recommended.  Her next bone density is due in 2 years.

## 2022-12-01 NOTE — Telephone Encounter (Signed)
Call placed to patient, left detailed message, ok per dpr.  Advised of results per Dr. Edward Jolly. Return call to office at [phone], option 4,  if any additional questions.   Encounter closed.

## 2022-12-04 ENCOUNTER — Encounter: Payer: Self-pay | Admitting: Obstetrics and Gynecology

## 2023-04-22 ENCOUNTER — Encounter: Payer: Self-pay | Admitting: Nurse Practitioner

## 2023-04-22 ENCOUNTER — Ambulatory Visit: Payer: No Typology Code available for payment source | Admitting: Nurse Practitioner

## 2023-04-22 VITALS — BP 112/72 | HR 60 | Ht 64.5 in | Wt 141.0 lb

## 2023-04-22 DIAGNOSIS — Z01419 Encounter for gynecological examination (general) (routine) without abnormal findings: Secondary | ICD-10-CM

## 2023-04-22 DIAGNOSIS — M8589 Other specified disorders of bone density and structure, multiple sites: Secondary | ICD-10-CM | POA: Diagnosis not present

## 2023-04-22 DIAGNOSIS — Z1331 Encounter for screening for depression: Secondary | ICD-10-CM | POA: Diagnosis not present

## 2023-04-22 DIAGNOSIS — Z780 Asymptomatic menopausal state: Secondary | ICD-10-CM

## 2023-04-22 NOTE — Progress Notes (Signed)
 Lori Wilcox 1961/01/13 [id number]   History:  63 y.o. G0 presents for annual exam. Postmenopausal - no HRT, no bleeding. Normal pap and mammogram history. H/O TIA November 2022.   Gynecologic History Patient's last menstrual period was 02/01/2012.   Contraception/Family planning: post menopausal status Sexually active: No  Health Maintenance Last Pap: 03/20/2021. Results were: Normal neg HPV Last mammogram: 08/21/2022. Results were: Normal Last colonoscopy: 2022. Results were: Benign polyps, 10-year recall Last Dexa: 11/16/2022. Results were: T-score -1.4, FRAX 7.5% / 0.7%  Flowsheet Row Office Visit from 04/22/2023 in [facility]  PHQ-2 Total Score 0      Past medical history, past surgical history, family history and social history were all reviewed and documented in the EPIC chart. Single. 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 Mother diagnosed with breast cancer at 55.  ROS:  A ROS was performed and pertinent positives and negatives are included.  Exam:  Vitals:   04/22/23 1502  BP: 112/72  Pulse: 60  SpO2: 99%  Weight: 141 lb (64 kg)  Height: 5' 4.5" (1.638 m)      Body mass index is 23.83 kg/m.  General appearance:  Normal Thyroid:  Symmetrical, normal in size, without palpable masses or nodularity. Respiratory  Auscultation:  Clear without wheezing or rhonchi Cardiovascular  Auscultation:  Regular rate, without rubs, murmurs or gallops  Edema/varicosities:  Not grossly evident Abdominal  Soft,nontender, without masses, guarding or rebound.  Liver/spleen:  No organomegaly noted  Hernia:  None appreciated  Skin  Inspection:  Grossly normal   Breasts: Examined lying and sitting.   Right: Without masses, retractions, discharge or axillary adenopathy.   Left: Without masses, retractions, discharge or axillary adenopathy. Pelvic: External genitalia:  no lesions              Urethra:   normal appearing urethra with no masses, tenderness or lesions              Bartholins and Skenes: normal                 Vagina: normal appearing vagina with normal color and discharge, no lesions. Atrophic changes              Cervix: no lesions Bimanual Exam:  Uterus:  no masses or tenderness              Adnexa: no mass, fullness, tenderness              Rectovaginal: Deferred              Anus:  normal, no lesions  Patient informed chaperone available to be present for breast and pelvic exam. Patient has requested no chaperone to be present. Patient has been advised what will be completed during breast and pelvic exam.   Assessment/Plan:  63 y.o. G0 for annual exam.   Well female exam with routine gynecological exam - Education provided on SBEs, importance of preventative screenings, current guidelines, high calcium diet, regular exercise, and multivitamin daily.  Labs done with cardiology.   Postmenopausal - no HRT, no bleeding  Osteopenia of multiple sites - 10/2022 T-score -1.4 without elevated FRAX. Continue Vitamin D supplement, she consumes high calcium diet. Also recommend regular exercise with resistance training. Very active with horses, lifting hay bales, etc.   Screening for cervical cancer - Normal Pap history.  Will repeat at 5-year interval per guidelines.   Screening for breast  cancer - Normal mammogram history.  Continue annual screenings.  Normal breast exam today.   Screening for colon cancer - 2022 colonoscopy. Will repeat at 10-year interval per GI's recommendation.   Return in about 1 year (around 04/21/2024) for Annual.     Lori Wilcox [facility], 3:23 PM 04/22/2023

## 2023-08-11 ENCOUNTER — Other Ambulatory Visit: Payer: Self-pay | Admitting: Obstetrics and Gynecology

## 2023-08-11 DIAGNOSIS — Z1231 Encounter for screening mammogram for malignant neoplasm of breast: Secondary | ICD-10-CM

## 2023-08-23 ENCOUNTER — Ambulatory Visit
Admission: RE | Admit: 2023-08-23 | Discharge: 2023-08-23 | Disposition: A | Discharge status: Home / Self Care | Source: Ambulatory Visit | Attending: Obstetrics and Gynecology | Admitting: Obstetrics and Gynecology

## 2023-08-23 DIAGNOSIS — Z1231 Encounter for screening mammogram for malignant neoplasm of breast: Secondary | ICD-10-CM

## 2023-08-26 ENCOUNTER — Ambulatory Visit: Payer: Self-pay | Admitting: Obstetrics and Gynecology

## 2023-10-08 ENCOUNTER — Other Ambulatory Visit: Payer: Self-pay | Admitting: Cardiology

## 2024-04-24 ENCOUNTER — Ambulatory Visit: Admitting: Nurse Practitioner
